# Patient Record
Sex: Male | Born: 1960 | Race: White | Hispanic: No | Smoking: Never smoker
Health system: Southern US, Community
[De-identification: ages and names within clinical notes are randomized; demographics above are authoritative.]

## PROBLEM LIST (undated history)

## (undated) DIAGNOSIS — E039 Hypothyroidism, unspecified: Secondary | ICD-10-CM

## (undated) DIAGNOSIS — M1990 Unspecified osteoarthritis, unspecified site: Secondary | ICD-10-CM

## (undated) DIAGNOSIS — B182 Chronic viral hepatitis C: Secondary | ICD-10-CM

## (undated) DIAGNOSIS — N138 Other obstructive and reflux uropathy: Secondary | ICD-10-CM

## (undated) DIAGNOSIS — K759 Inflammatory liver disease, unspecified: Secondary | ICD-10-CM

## (undated) DIAGNOSIS — F602 Antisocial personality disorder: Secondary | ICD-10-CM

## (undated) DIAGNOSIS — I10 Essential (primary) hypertension: Secondary | ICD-10-CM

## (undated) DIAGNOSIS — F319 Bipolar disorder, unspecified: Secondary | ICD-10-CM

## (undated) DIAGNOSIS — M5020 Other cervical disc displacement, unspecified cervical region: Secondary | ICD-10-CM

## (undated) DIAGNOSIS — B1910 Unspecified viral hepatitis B without hepatic coma: Secondary | ICD-10-CM

## (undated) DIAGNOSIS — E119 Type 2 diabetes mellitus without complications: Secondary | ICD-10-CM

## (undated) DIAGNOSIS — F99 Mental disorder, not otherwise specified: Secondary | ICD-10-CM

## (undated) DIAGNOSIS — K7460 Unspecified cirrhosis of liver: Secondary | ICD-10-CM

## (undated) DIAGNOSIS — N401 Enlarged prostate with lower urinary tract symptoms: Secondary | ICD-10-CM

## (undated) DIAGNOSIS — G8929 Other chronic pain: Secondary | ICD-10-CM

## (undated) HISTORY — DX: Hypothyroidism, unspecified: E03.9

## (undated) HISTORY — DX: Inflammatory liver disease, unspecified: K75.9

## (undated) HISTORY — DX: Mental disorder, not otherwise specified: F99

## (undated) HISTORY — PX: EXPLORATORY LAPAROTOMY: SUR591

## (undated) HISTORY — DX: Essential (primary) hypertension: I10

## (undated) HISTORY — DX: Unspecified cirrhosis of liver: K74.60

## (undated) HISTORY — PX: LAPAROSCOPIC CHOLECYSTECTOMY: SUR755

## (undated) HISTORY — PX: OTHER SURGICAL HISTORY: SHX169

## (undated) HISTORY — DX: Type 2 diabetes mellitus without complications: E11.9

---

## 1998-02-15 ENCOUNTER — Encounter: Admission: RE | Admit: 1998-02-15 | Discharge: 1998-02-15 | Payer: Self-pay | Admitting: Internal Medicine

## 1998-04-14 ENCOUNTER — Encounter: Admission: RE | Admit: 1998-04-14 | Discharge: 1998-04-14 | Payer: Self-pay | Admitting: *Deleted

## 1998-11-24 ENCOUNTER — Emergency Department (HOSPITAL_COMMUNITY): Admission: EM | Admit: 1998-11-24 | Discharge: 1998-11-24 | Payer: Self-pay | Admitting: Emergency Medicine

## 1998-12-10 ENCOUNTER — Emergency Department (HOSPITAL_COMMUNITY): Admission: EM | Admit: 1998-12-10 | Discharge: 1998-12-10 | Payer: Self-pay | Admitting: Emergency Medicine

## 1999-02-05 ENCOUNTER — Emergency Department (HOSPITAL_COMMUNITY): Admission: EM | Admit: 1999-02-05 | Discharge: 1999-02-05 | Payer: Self-pay | Admitting: Emergency Medicine

## 1999-02-05 ENCOUNTER — Encounter: Payer: Self-pay | Admitting: Emergency Medicine

## 1999-06-27 ENCOUNTER — Encounter: Admission: RE | Admit: 1999-06-27 | Discharge: 1999-06-27 | Payer: Self-pay | Admitting: Hematology and Oncology

## 1999-07-16 ENCOUNTER — Encounter: Admission: RE | Admit: 1999-07-16 | Discharge: 1999-07-16 | Payer: Self-pay | Admitting: Internal Medicine

## 1999-08-14 ENCOUNTER — Emergency Department (HOSPITAL_COMMUNITY): Admission: EM | Admit: 1999-08-14 | Discharge: 1999-08-14 | Payer: Self-pay | Admitting: Emergency Medicine

## 1999-08-14 ENCOUNTER — Encounter: Payer: Self-pay | Admitting: Emergency Medicine

## 1999-10-04 ENCOUNTER — Emergency Department (HOSPITAL_COMMUNITY): Admission: EM | Admit: 1999-10-04 | Discharge: 1999-10-04 | Payer: Self-pay | Admitting: Emergency Medicine

## 1999-10-04 ENCOUNTER — Encounter: Payer: Self-pay | Admitting: Emergency Medicine

## 1999-12-23 ENCOUNTER — Emergency Department (HOSPITAL_COMMUNITY): Admission: EM | Admit: 1999-12-23 | Discharge: 1999-12-23 | Payer: Self-pay | Admitting: Emergency Medicine

## 1999-12-23 ENCOUNTER — Encounter: Payer: Self-pay | Admitting: Emergency Medicine

## 2000-04-23 ENCOUNTER — Encounter: Payer: Self-pay | Admitting: Emergency Medicine

## 2000-04-23 ENCOUNTER — Emergency Department (HOSPITAL_COMMUNITY): Admission: AD | Admit: 2000-04-23 | Discharge: 2000-04-23 | Payer: Self-pay

## 2000-04-24 ENCOUNTER — Encounter: Payer: Self-pay | Admitting: Emergency Medicine

## 2001-11-22 ENCOUNTER — Encounter: Payer: Self-pay | Admitting: *Deleted

## 2001-11-22 ENCOUNTER — Emergency Department (HOSPITAL_COMMUNITY): Admission: EM | Admit: 2001-11-22 | Discharge: 2001-11-22 | Payer: Self-pay | Admitting: *Deleted

## 2002-06-06 ENCOUNTER — Emergency Department (HOSPITAL_COMMUNITY): Admission: EM | Admit: 2002-06-06 | Discharge: 2002-06-06 | Payer: Self-pay | Admitting: Emergency Medicine

## 2002-11-23 ENCOUNTER — Inpatient Hospital Stay (HOSPITAL_COMMUNITY): Admission: EM | Admit: 2002-11-23 | Discharge: 2002-12-06 | Payer: Self-pay | Admitting: Psychiatry

## 2003-01-12 ENCOUNTER — Encounter: Payer: Self-pay | Admitting: Internal Medicine

## 2003-01-12 ENCOUNTER — Ambulatory Visit (HOSPITAL_COMMUNITY): Admission: RE | Admit: 2003-01-12 | Discharge: 2003-01-12 | Payer: Self-pay | Admitting: Internal Medicine

## 2003-01-18 ENCOUNTER — Observation Stay (HOSPITAL_COMMUNITY): Admission: RE | Admit: 2003-01-18 | Discharge: 2003-01-19 | Payer: Self-pay | Admitting: Internal Medicine

## 2003-04-01 ENCOUNTER — Inpatient Hospital Stay (HOSPITAL_COMMUNITY): Admission: EM | Admit: 2003-04-01 | Discharge: 2003-04-08 | Payer: Self-pay | Admitting: Psychiatry

## 2003-05-30 ENCOUNTER — Emergency Department (HOSPITAL_COMMUNITY): Admission: EM | Admit: 2003-05-30 | Discharge: 2003-05-30 | Payer: Self-pay

## 2003-08-30 ENCOUNTER — Emergency Department (HOSPITAL_COMMUNITY): Admission: EM | Admit: 2003-08-30 | Discharge: 2003-08-30 | Payer: Self-pay | Admitting: Emergency Medicine

## 2003-11-25 ENCOUNTER — Emergency Department (HOSPITAL_COMMUNITY): Admission: EM | Admit: 2003-11-25 | Discharge: 2003-11-25 | Payer: Self-pay | Admitting: Emergency Medicine

## 2004-01-12 ENCOUNTER — Inpatient Hospital Stay (HOSPITAL_COMMUNITY): Admission: RE | Admit: 2004-01-12 | Discharge: 2004-01-31 | Payer: Self-pay | Admitting: Psychiatry

## 2004-01-27 ENCOUNTER — Encounter: Payer: Self-pay | Admitting: Emergency Medicine

## 2004-05-24 ENCOUNTER — Emergency Department (HOSPITAL_COMMUNITY): Admission: EM | Admit: 2004-05-24 | Discharge: 2004-05-25 | Payer: Self-pay | Admitting: Emergency Medicine

## 2007-04-07 ENCOUNTER — Emergency Department (HOSPITAL_COMMUNITY): Admission: EM | Admit: 2007-04-07 | Discharge: 2007-04-07 | Payer: Self-pay | Admitting: Emergency Medicine

## 2007-05-13 ENCOUNTER — Emergency Department (HOSPITAL_COMMUNITY): Admission: EM | Admit: 2007-05-13 | Discharge: 2007-05-13 | Payer: Self-pay | Admitting: Emergency Medicine

## 2007-06-30 ENCOUNTER — Ambulatory Visit: Payer: Self-pay | Admitting: Psychiatry

## 2007-06-30 ENCOUNTER — Inpatient Hospital Stay (HOSPITAL_COMMUNITY): Admission: AD | Admit: 2007-06-30 | Discharge: 2007-07-06 | Payer: Self-pay | Admitting: Psychiatry

## 2007-10-01 ENCOUNTER — Emergency Department (HOSPITAL_COMMUNITY): Admission: EM | Admit: 2007-10-01 | Discharge: 2007-10-01 | Payer: Self-pay | Admitting: Emergency Medicine

## 2007-12-03 ENCOUNTER — Emergency Department (HOSPITAL_COMMUNITY): Admission: EM | Admit: 2007-12-03 | Discharge: 2007-12-03 | Payer: Self-pay | Admitting: Emergency Medicine

## 2008-03-18 ENCOUNTER — Emergency Department (HOSPITAL_COMMUNITY): Admission: EM | Admit: 2008-03-18 | Discharge: 2008-03-18 | Payer: Self-pay | Admitting: Emergency Medicine

## 2010-03-05 ENCOUNTER — Ambulatory Visit (HOSPITAL_COMMUNITY): Admission: RE | Admit: 2010-03-05 | Discharge: 2010-03-05 | Payer: Self-pay | Admitting: Family Medicine

## 2010-04-21 ENCOUNTER — Emergency Department (HOSPITAL_COMMUNITY): Admission: EM | Admit: 2010-04-21 | Discharge: 2010-04-21 | Payer: Self-pay | Admitting: Emergency Medicine

## 2010-08-23 ENCOUNTER — Encounter: Payer: Self-pay | Admitting: Emergency Medicine

## 2010-08-23 ENCOUNTER — Inpatient Hospital Stay (HOSPITAL_COMMUNITY): Admission: EM | Admit: 2010-08-23 | Discharge: 2010-08-24 | Payer: Self-pay | Admitting: General Surgery

## 2010-09-03 ENCOUNTER — Other Ambulatory Visit: Payer: Self-pay | Admitting: Emergency Medicine

## 2010-09-04 ENCOUNTER — Ambulatory Visit: Payer: Self-pay | Admitting: Psychiatry

## 2010-09-04 ENCOUNTER — Inpatient Hospital Stay (HOSPITAL_COMMUNITY)
Admission: EM | Admit: 2010-09-04 | Discharge: 2010-09-17 | Payer: Self-pay | Source: Home / Self Care | Admitting: Psychiatry

## 2010-09-09 ENCOUNTER — Emergency Department (HOSPITAL_COMMUNITY)
Admission: EM | Admit: 2010-09-09 | Discharge: 2010-09-09 | Disposition: A | Discharge status: Other Acute Inpatient Hospital | Payer: Self-pay | Source: Home / Self Care | Admitting: Emergency Medicine

## 2010-11-18 ENCOUNTER — Encounter: Payer: Self-pay | Admitting: Family Medicine

## 2011-01-08 LAB — CARDIAC PANEL(CRET KIN+CKTOT+MB+TROPI)
Total CK: 120 U/L (ref 7–232)
Troponin I: 0.01 ng/mL (ref 0.00–0.06)

## 2011-01-08 LAB — URINE MICROSCOPIC-ADD ON

## 2011-01-08 LAB — CBC
HCT: 39.3 % (ref 39.0–52.0)
MCH: 31 pg (ref 26.0–34.0)
MCHC: 33.5 g/dL (ref 30.0–36.0)
RDW: 14.2 % (ref 11.5–15.5)

## 2011-01-08 LAB — URINALYSIS, ROUTINE W REFLEX MICROSCOPIC
Bilirubin Urine: NEGATIVE
Glucose, UA: NEGATIVE mg/dL
Ketones, ur: NEGATIVE mg/dL
Leukocytes, UA: NEGATIVE

## 2011-01-08 LAB — COMPREHENSIVE METABOLIC PANEL
ALT: 34 U/L (ref 0–53)
AST: 36 U/L (ref 0–37)
Albumin: 3.8 g/dL (ref 3.5–5.2)
Alkaline Phosphatase: 89 U/L (ref 39–117)
CO2: 27 mEq/L (ref 19–32)
Calcium: 8.8 mg/dL (ref 8.4–10.5)
GFR calc non Af Amer: >60 mL/min (ref >60)
Total Bilirubin: 0.7 mg/dL (ref 0.3–1.2)
Total Protein: 7.3 g/dL (ref 6.0–8.3)

## 2011-01-08 LAB — POCT I-STAT, CHEM 8
BUN: 13 mg/dL (ref 6–23)
Creatinine, Ser: 0.9 mg/dL (ref 0.4–1.5)
Hemoglobin: 15.3 g/dL (ref 13.0–17.0)
TCO2: 32 mmol/L (ref 0–100)

## 2011-01-08 LAB — DIFFERENTIAL
Lymphocytes Relative: 21 % (ref 12–46)
Monocytes Absolute: 0.8 10*3/uL (ref 0.1–1.0)
Neutro Abs: 7.6 10*3/uL (ref 1.7–7.7)

## 2011-01-08 LAB — LACTATE DEHYDROGENASE: LDH: 165 U/L (ref 94–250)

## 2011-01-08 LAB — RAPID URINE DRUG SCREEN, HOSP PERFORMED
Benzodiazepines: POSITIVE — AB
Opiates: NOT DETECTED
Tetrahydrocannabinol: POSITIVE — AB

## 2011-01-09 LAB — CBC
Hemoglobin: 14 g/dL (ref 13.0–17.0)
MCH: 30.2 pg (ref 26.0–34.0)
MCH: 30.8 pg (ref 26.0–34.0)
MCHC: 32.3 g/dL (ref 30.0–36.0)
MCV: 93.2 fL (ref 78.0–100.0)
Platelets: 247 10*3/uL (ref 150–400)
RDW: 14 % (ref 11.5–15.5)
WBC: 12.4 10*3/uL — ABNORMAL HIGH (ref 4.0–10.5)
WBC: 15.7 10*3/uL — ABNORMAL HIGH (ref 4.0–10.5)

## 2011-01-09 LAB — BASIC METABOLIC PANEL
Chloride: 104 mEq/L (ref 96–112)
Glucose, Bld: 153 mg/dL — ABNORMAL HIGH (ref 70–99)
Potassium: 4.5 mEq/L (ref 3.5–5.1)
Sodium: 137 mEq/L (ref 135–145)

## 2011-01-09 LAB — DIFFERENTIAL
Basophils Absolute: 0 10*3/uL (ref 0.0–0.1)
Eosinophils Relative: 1 % (ref 0–5)
Lymphocytes Relative: 17 % (ref 12–46)
Monocytes Absolute: 0.9 10*3/uL (ref 0.1–1.0)
Monocytes Relative: 6 % (ref 3–12)

## 2011-01-09 LAB — POCT I-STAT, CHEM 8
BUN: 10 mg/dL (ref 6–23)
Calcium, Ion: 1.1 mmol/L — ABNORMAL LOW (ref 1.12–1.32)
Chloride: 103 meq/L (ref 96–112)
Creatinine, Ser: 0.8 mg/dL (ref 0.4–1.5)
Glucose, Bld: 116 mg/dL — ABNORMAL HIGH (ref 70–99)
HCT: 46 % (ref 39.0–52.0)
Hemoglobin: 15.6 g/dL (ref 13.0–17.0)
Potassium: 3.7 meq/L (ref 3.5–5.1)
Sodium: 139 meq/L (ref 135–145)
TCO2: 27 mmol/L (ref 0–100)

## 2011-01-09 LAB — CROSSMATCH
ABO/RH(D): O POS
Antibody Screen: NEGATIVE

## 2011-01-09 LAB — ETHANOL: Alcohol, Ethyl (B): <5 mg/dL (ref 0–10)

## 2011-01-09 LAB — GLUCOSE, CAPILLARY: Glucose-Capillary: 146 mg/dL — ABNORMAL HIGH (ref 70–99)

## 2011-01-09 LAB — PROTIME-INR
INR: 0.97 (ref 0.00–1.49)
Prothrombin Time: 13.1 seconds (ref 11.6–15.2)

## 2011-01-09 LAB — ABO/RH: ABO/RH(D): O POS

## 2011-01-09 LAB — APTT: aPTT: 25 seconds (ref 24–37)

## 2011-01-13 LAB — CULTURE, ROUTINE-ABSCESS
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
## 2011-03-15 NOTE — Op Note (Signed)
NAME:  Julian Evans, Julian Evans                          ACCOUNT NO.:  [id number]   MEDICAL RECORD NO.:  [id number]                   PATIENT TYPE:  OBV   LOCATION:  A302                                 FACILITY:  [facility]   PHYSICIAN:  Bernerd Limbo. Leona Carry, M.D.             DATE OF BIRTH:  [birth year]   DATE OF PROCEDURE:  01/18/2003  DATE OF DISCHARGE:                                 OPERATIVE REPORT   PREOPERATIVE DIAGNOSIS:  Chronic cholecystitis and cholelithiasis.   POSTOPERATIVE DIAGNOSIS:  Chronic cholecystitis and cholelithiasis.   OPERATION PERFORMED:  Laparoscopic cholecystectomy.   COMPLICATIONS:  None.   SURGEON:  Bernerd Limbo. Destefano, M.D.   DESCRIPTION OF PROCEDURE:  Under adequate general anesthesia, the patient  was prepped and draped in the usual manner.   A small incision was made above the umbilicus.  Through this opening, a  Veress needle was inserted into the peritoneal cavity, and the abdomen was  insufflated up to 4 to 5 mm of carbon dioxide, 10 to 15 cc of pressure.  The  needle was removed, and the trocar with the Visiport attached was inserted  into the peritoneal cavity.  There was noted to be a lot of adhesions and  omentum adherent to the gallbladder.  A small incision was then made about 2  cm below and to the right of the xiphoid, and through this opening, a 10-mm  trocar was inserted through the 10-mm port.  The [place] dissecting  instrument was inserted.  Two small incisions were made in the right, the  first about 2 cm below the lowest rib, the second 2 cm below and lateral to  the first opening.  Through these openings, 5 mm trocars were inserted.  Through the 5-mm ports, the retracting forceps were inserted.  The  gallbladder was grasped at the tip of the infundibulum and upward and  lateral traction placed on this structure.  The gallbladder was markedly  distended.  Utilizing blunt dissection, the adhesions were removed.  The  cystic duct was identified,  triply clipped distally, singly clipped  proximally, and then transected.  The cystic artery was identified, doubly  clipped distally, singly clipped proximally, and then transected.  There was  a small amount of immediate bleeding after this; however, using two more  clips, the bleeding came under control.  Then utilizing the electrocautery,  the gallbladder was removed with minimal difficulty.  The gallbladder bed  was then thoroughly irrigated, and a few small bleeding points were  electrofulgurated.  At this point in the procedure, all bleeding appeared to  be under control.  Two pieces of Surgicel were placed in the gallbladder  bed.  The gallbladder was then removed from through the upper 10-mm port.  After determining that all bleeding was under control, all of the trocars  were removed and the abdomen  decompressed.  The fascial layers were closed with interrupted #1  Vicryl.  The skin was closed with skin clips.  OpSite dressing applied.   The patient tolerated the procedure nicely and left the operating room in  good condition.                                               Bernerd Limbo. Leona Carry, M.D.    NMD/MEDQ  D:  01/18/2003  T:  01/18/2003  Job:  [id number]
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
## 2011-08-09 LAB — COMPREHENSIVE METABOLIC PANEL
ALT: 149 — ABNORMAL HIGH
Albumin: 3.8
Alkaline Phosphatase: 110
BUN: 17
Calcium: 9.2
Potassium: 4
Sodium: 134 — ABNORMAL LOW
Total Protein: 7.4

## 2011-08-09 LAB — URINALYSIS, ROUTINE W REFLEX MICROSCOPIC
Bilirubin Urine: NEGATIVE
Glucose, UA: NEGATIVE
Specific Gravity, Urine: 1.028
pH: 5.5

## 2011-08-09 LAB — URINE DRUGS OF ABUSE SCREEN W ALC, ROUTINE (REF LAB)
Cocaine Metabolites: NEGATIVE
Ethyl Alcohol: <5
Methadone: NEGATIVE
Opiate Screen, Urine: NEGATIVE
Phencyclidine (PCP): NEGATIVE
Propoxyphene: NEGATIVE

## 2011-08-09 LAB — CBC
MCV: 92.8
RDW: 13.6
WBC: 12 — ABNORMAL HIGH

## 2011-08-09 LAB — HEPATITIS PANEL, ACUTE
HCV Ab: POSITIVE — AB
Hep A IgM: NEGATIVE
Hep B C IgM: NEGATIVE
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
## 2013-01-06 ENCOUNTER — Emergency Department (HOSPITAL_COMMUNITY)
Admission: EM | Admit: 2013-01-06 | Discharge: 2013-01-07 | Disposition: A | Discharge status: Other Acute Inpatient Hospital | Payer: Medicaid Other | Attending: Emergency Medicine | Admitting: Emergency Medicine

## 2013-01-06 ENCOUNTER — Telehealth (HOSPITAL_COMMUNITY): Payer: Self-pay

## 2013-01-06 ENCOUNTER — Ambulatory Visit (HOSPITAL_COMMUNITY): Admission: RE | Admit: 2013-01-06 | Payer: Self-pay | Source: Home / Self Care | Admitting: Psychiatry

## 2013-01-06 ENCOUNTER — Encounter (HOSPITAL_COMMUNITY): Payer: Self-pay

## 2013-01-06 DIAGNOSIS — Z862 Personal history of diseases of the blood and blood-forming organs and certain disorders involving the immune mechanism: Secondary | ICD-10-CM | POA: Insufficient documentation

## 2013-01-06 DIAGNOSIS — F1910 Other psychoactive substance abuse, uncomplicated: Secondary | ICD-10-CM

## 2013-01-06 DIAGNOSIS — R443 Hallucinations, unspecified: Secondary | ICD-10-CM

## 2013-01-06 DIAGNOSIS — E119 Type 2 diabetes mellitus without complications: Secondary | ICD-10-CM | POA: Insufficient documentation

## 2013-01-06 DIAGNOSIS — E876 Hypokalemia: Secondary | ICD-10-CM | POA: Insufficient documentation

## 2013-01-06 DIAGNOSIS — R454 Irritability and anger: Secondary | ICD-10-CM

## 2013-01-06 DIAGNOSIS — IMO0002 Reserved for concepts with insufficient information to code with codable children: Secondary | ICD-10-CM | POA: Insufficient documentation

## 2013-01-06 DIAGNOSIS — Z8639 Personal history of other endocrine, nutritional and metabolic disease: Secondary | ICD-10-CM | POA: Insufficient documentation

## 2013-01-06 DIAGNOSIS — Z8719 Personal history of other diseases of the digestive system: Secondary | ICD-10-CM | POA: Insufficient documentation

## 2013-01-06 DIAGNOSIS — I10 Essential (primary) hypertension: Secondary | ICD-10-CM | POA: Insufficient documentation

## 2013-01-06 DIAGNOSIS — R45850 Homicidal ideations: Secondary | ICD-10-CM

## 2013-01-06 DIAGNOSIS — F489 Nonpsychotic mental disorder, unspecified: Secondary | ICD-10-CM | POA: Insufficient documentation
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
## 2013-01-07 ENCOUNTER — Encounter (HOSPITAL_COMMUNITY): Payer: Self-pay | Admitting: *Deleted

## 2013-01-07 ENCOUNTER — Inpatient Hospital Stay (HOSPITAL_COMMUNITY)
Admission: EM | Admit: 2013-01-07 | Discharge: 2013-01-15 | DRG: 885 | Disposition: A | Discharge status: Home / Self Care | Payer: MEDICAID | Source: Intra-hospital | Attending: Psychiatry | Admitting: Psychiatry

## 2013-01-07 DIAGNOSIS — F3130 Bipolar disorder, current episode depressed, mild or moderate severity, unspecified: Secondary | ICD-10-CM | POA: Diagnosis present

## 2013-01-07 DIAGNOSIS — K759 Inflammatory liver disease, unspecified: Secondary | ICD-10-CM

## 2013-01-07 DIAGNOSIS — F1010 Alcohol abuse, uncomplicated: Secondary | ICD-10-CM

## 2013-01-07 DIAGNOSIS — I10 Essential (primary) hypertension: Secondary | ICD-10-CM

## 2013-01-07 DIAGNOSIS — K7460 Unspecified cirrhosis of liver: Secondary | ICD-10-CM | POA: Diagnosis present

## 2013-01-07 DIAGNOSIS — F411 Generalized anxiety disorder: Secondary | ICD-10-CM | POA: Diagnosis present

## 2013-01-07 DIAGNOSIS — F4310 Post-traumatic stress disorder, unspecified: Secondary | ICD-10-CM | POA: Diagnosis present

## 2013-01-07 DIAGNOSIS — F99 Mental disorder, not otherwise specified: Secondary | ICD-10-CM

## 2013-01-07 DIAGNOSIS — R45850 Homicidal ideations: Secondary | ICD-10-CM

## 2013-01-07 DIAGNOSIS — E039 Hypothyroidism, unspecified: Secondary | ICD-10-CM | POA: Diagnosis present

## 2013-01-07 DIAGNOSIS — E119 Type 2 diabetes mellitus without complications: Secondary | ICD-10-CM | POA: Insufficient documentation

## 2013-01-07 DIAGNOSIS — Z79899 Other long term (current) drug therapy: Secondary | ICD-10-CM

## 2013-01-07 HISTORY — DX: Other cervical disc displacement, unspecified cervical region: M50.20

## 2013-01-07 LAB — COMPREHENSIVE METABOLIC PANEL
ALT: 61 U/L — ABNORMAL HIGH (ref 0–53)
AST: 63 U/L — ABNORMAL HIGH (ref 0–37)
Albumin: 3.8 g/dL (ref 3.5–5.2)
Alkaline Phosphatase: 103 U/L (ref 39–117)
CO2: 27 mEq/L (ref 19–32)
Chloride: 94 mEq/L — ABNORMAL LOW (ref 96–112)
Creatinine, Ser: 0.82 mg/dL (ref 0.50–1.35)
GFR calc non Af Amer: >90 mL/min (ref >90)
Potassium: 3.2 mEq/L — ABNORMAL LOW (ref 3.5–5.1)
Sodium: 134 mEq/L — ABNORMAL LOW (ref 135–145)
Total Bilirubin: 0.3 mg/dL (ref 0.3–1.2)

## 2013-01-07 LAB — CBC WITH DIFFERENTIAL/PLATELET
Basophils Absolute: 0 10*3/uL (ref 0.0–0.1)
HCT: 42.8 % (ref 39.0–52.0)
Lymphocytes Relative: 35 % (ref 12–46)
Monocytes Absolute: 0.8 10*3/uL (ref 0.1–1.0)
Neutro Abs: 4.8 10*3/uL (ref 1.7–7.7)
Neutrophils Relative %: 54 % (ref 43–77)
RDW: 14.3 % (ref 11.5–15.5)
WBC: 8.9 10*3/uL (ref 4.0–10.5)

## 2013-01-07 LAB — ETHANOL: Alcohol, Ethyl (B): 152 mg/dL — ABNORMAL HIGH (ref 0–11)

## 2013-01-07 LAB — RAPID URINE DRUG SCREEN, HOSP PERFORMED
Barbiturates: NOT DETECTED
Tetrahydrocannabinol: POSITIVE — AB

## 2013-01-07 MED ORDER — ZOLPIDEM TARTRATE 5 MG PO TABS: 5.0000 mg | ORAL_TABLET | Freq: Every evening | ORAL | Status: DC | PRN

## 2013-01-07 MED ORDER — ALUM & MAG HYDROXIDE-SIMETH 200-200-20 MG/5ML PO SUSP: 30.0000 mL | ORAL | Status: DC | PRN

## 2013-01-07 MED ORDER — LISINOPRIL 10 MG PO TABS
10.0000 mg | ORAL_TABLET | Freq: Every day | ORAL | Status: DC
  Administered 2013-01-07 – 2013-01-15 (×6): 10 mg via ORAL
  Filled 2013-01-07 (×10): qty 1

## 2013-01-07 MED ORDER — ACETAMINOPHEN 325 MG PO TABS: 650.0000 mg | ORAL_TABLET | ORAL | Status: DC | PRN

## 2013-01-07 MED ORDER — IBUPROFEN 600 MG PO TABS: 600.0000 mg | ORAL_TABLET | Freq: Three times a day (TID) | ORAL | Status: DC | PRN

## 2013-01-07 MED ORDER — HYDROXYZINE HCL 25 MG PO TABS
25.0000 mg | ORAL_TABLET | Freq: Four times a day (QID) | ORAL | Status: AC | PRN
  Administered 2013-01-10: 25 mg via ORAL

## 2013-01-07 MED ORDER — MAGNESIUM HYDROXIDE 400 MG/5ML PO SUSP: 30.0000 mL | Freq: Every day | ORAL | Status: DC | PRN

## 2013-01-07 MED ORDER — ACETAMINOPHEN 325 MG PO TABS: 650.0000 mg | ORAL_TABLET | Freq: Four times a day (QID) | ORAL | Status: DC | PRN

## 2013-01-07 MED ORDER — LOPERAMIDE HCL 2 MG PO CAPS: 2.0000 mg | ORAL_CAPSULE | ORAL | Status: AC | PRN

## 2013-01-07 MED ORDER — CHLORDIAZEPOXIDE HCL 25 MG PO CAPS
50.0000 mg | ORAL_CAPSULE | Freq: Once | ORAL | Status: AC
  Administered 2013-01-07: 50 mg via ORAL
  Filled 2013-01-07: qty 2

## 2013-01-07 MED ORDER — ONDANSETRON 4 MG PO TBDP: 4.0000 mg | ORAL_TABLET | Freq: Four times a day (QID) | ORAL | Status: AC | PRN

## 2013-01-07 MED ORDER — POTASSIUM CHLORIDE CRYS ER 20 MEQ PO TBCR
40.0000 meq | EXTENDED_RELEASE_TABLET | Freq: Once | ORAL | Status: AC
  Administered 2013-01-07: 40 meq via ORAL
  Filled 2013-01-07: qty 2

## 2013-01-07 MED ORDER — CHLORDIAZEPOXIDE HCL 25 MG PO CAPS
25.0000 mg | ORAL_CAPSULE | Freq: Three times a day (TID) | ORAL | Status: AC
  Administered 2013-01-09 – 2013-01-10 (×3): 25 mg via ORAL
  Filled 2013-01-07 (×3): qty 1

## 2013-01-07 MED ORDER — LORAZEPAM 1 MG PO TABS
1.0000 mg | ORAL_TABLET | Freq: Three times a day (TID) | ORAL | Status: DC | PRN
  Administered 2013-01-07: 1 mg via ORAL
  Filled 2013-01-07: qty 1

## 2013-01-07 MED ORDER — TRAZODONE HCL 50 MG PO TABS
50.0000 mg | ORAL_TABLET | Freq: Every day | ORAL | Status: DC
Start: 2013-01-07 — End: 2013-01-10
  Administered 2013-01-07 – 2013-01-09 (×3): 50 mg via ORAL
  Filled 2013-01-07 (×6): qty 1

## 2013-01-07 MED ORDER — CHLORDIAZEPOXIDE HCL 25 MG PO CAPS
25.0000 mg | ORAL_CAPSULE | Freq: Four times a day (QID) | ORAL | Status: AC
  Administered 2013-01-07 – 2013-01-09 (×5): 25 mg via ORAL
  Filled 2013-01-07 (×5): qty 1

## 2013-01-07 MED ORDER — INFLUENZA VIRUS VACC SPLIT PF IM SUSP: 0.5000 mL | INTRAMUSCULAR | Status: AC

## 2013-01-07 MED ORDER — ADULT MULTIVITAMIN W/MINERALS CH
1.0000 | ORAL_TABLET | Freq: Every day | ORAL | Status: DC
  Administered 2013-01-07 – 2013-01-15 (×6): 1 via ORAL
  Filled 2013-01-07 (×10): qty 1

## 2013-01-07 MED ORDER — CHLORDIAZEPOXIDE HCL 25 MG PO CAPS
25.0000 mg | ORAL_CAPSULE | Freq: Four times a day (QID) | ORAL | Status: AC | PRN
  Filled 2013-01-07: qty 1

## 2013-01-07 MED ORDER — ONDANSETRON HCL 4 MG PO TABS: 4.0000 mg | ORAL_TABLET | Freq: Three times a day (TID) | ORAL | Status: DC | PRN

## 2013-01-07 MED ORDER — PNEUMOCOCCAL VAC POLYVALENT 25 MCG/0.5ML IJ INJ: 0.5000 mL | INJECTION | INTRAMUSCULAR | Status: AC

## 2013-01-07 MED ORDER — CHLORDIAZEPOXIDE HCL 25 MG PO CAPS: 25.0000 mg | ORAL_CAPSULE | Freq: Every day | ORAL | Status: AC

## 2013-01-07 MED ORDER — NICOTINE 21 MG/24HR TD PT24: 21.0000 mg | MEDICATED_PATCH | Freq: Every day | TRANSDERMAL | Status: DC

## 2013-01-07 MED ORDER — THIAMINE HCL 100 MG/ML IJ SOLN: 100.0000 mg | Freq: Once | INTRAMUSCULAR | Status: DC

## 2013-01-07 MED ORDER — INSULIN ASPART 100 UNIT/ML ~~LOC~~ SOLN
0.0000 [IU] | Freq: Three times a day (TID) | SUBCUTANEOUS | Status: DC
  Administered 2013-01-09: 2 [IU] via SUBCUTANEOUS
  Administered 2013-01-10: 3 [IU] via SUBCUTANEOUS
  Administered 2013-01-10 – 2013-01-11 (×2): 5 [IU] via SUBCUTANEOUS
  Administered 2013-01-11: 2 [IU] via SUBCUTANEOUS
  Administered 2013-01-12: 5 [IU] via SUBCUTANEOUS
  Administered 2013-01-12: 3 [IU] via SUBCUTANEOUS
  Administered 2013-01-13 – 2013-01-15 (×4): 2 [IU] via SUBCUTANEOUS

## 2013-01-07 MED ORDER — NICOTINE 21 MG/24HR TD PT24
21.0000 mg | MEDICATED_PATCH | Freq: Every day | TRANSDERMAL | Status: DC
  Filled 2013-01-07 (×3): qty 1

## 2013-01-07 MED ORDER — VITAMIN B-1 100 MG PO TABS
100.0000 mg | ORAL_TABLET | Freq: Every day | ORAL | Status: DC
  Administered 2013-01-09 – 2013-01-15 (×5): 100 mg via ORAL
  Filled 2013-01-07 (×9): qty 1

## 2013-01-07 MED ORDER — CHLORDIAZEPOXIDE HCL 25 MG PO CAPS
25.0000 mg | ORAL_CAPSULE | ORAL | Status: AC
  Filled 2013-01-07: qty 1

## 2013-01-07 MED ORDER — NAPROXEN SODIUM 275 MG PO TABS
275.0000 mg | ORAL_TABLET | Freq: Two times a day (BID) | ORAL | Status: DC
Start: 2013-01-07 — End: 2013-01-08
  Administered 2013-01-07: 275 mg via ORAL
  Filled 2013-01-07 (×5): qty 1

## 2013-01-07 NOTE — ED Provider Notes (Signed)
Medical screening examination/treatment/procedure(s) were performed by non-physician practitioner and as supervising physician I was immediately available for consultation/collaboration.  Olivia Mackie, MD 01/07/13 [phone]

## 2013-01-07 NOTE — ED Provider Notes (Signed)
History     CSN: [id number]  Arrival date & time 01/06/13  2349   First MD Initiated Contact with Patient 01/07/13 [phone]      Chief Complaint  Patient presents with  . Medical Clearance    (Consider location/radiation/quality/duration/timing/severity/associated sxs/prior treatment) HPI Julian Evans is a 52 y.o. male who presents to ED with complaint of hearing voices, anger management, and wanting ho hurt others. States has been off his medications, both psychiatric and medical since December 11th, 2013. States medications were lost while traveling. States has not seen his doctor or refilled his meds since. Pt refusing details when talking to me about who he has thoughts about hurting, or does not volunteer any information about his voices and other questions. Pt keeps stating, "I need help." Pt sent here for medical clearance after being assessed at [facility]. Pt deneis SI.    Past Medical History  Diagnosis Date  . Hypertension   . Hypothyroidism   . Mental disorder   . Diabetes mellitus without complication   . Hepatitis   . Cirrhosis of liver     History reviewed. No pertinent past surgical history.  No family history on file.  History  Substance Use Topics  . Smoking status: Never Smoker   . Smokeless tobacco: Not on file  . Alcohol Use: 2.4 oz/week    4 Cans of beer per week      Review of Systems  Psychiatric/Behavioral: Positive for hallucinations, behavioral problems, sleep disturbance and agitation. Negative for suicidal ideas.  All other systems reviewed and are negative.    Allergies  Mellaril and Thorazine  Home Medications  No current outpatient prescriptions on file.  BP 147/87  Pulse 77  Temp(Src) 97.9 F (36.6 C) (Oral)  Resp 20  SpO2 95%  Physical Exam  Nursing note and vitals reviewed. Constitutional: He is oriented to person, place, and time. He appears well-developed and well-nourished. No distress.  HENT:  Head: Normocephalic.  Eyes:  Conjunctivae are normal. Pupils are equal, round, and reactive to light.  Cardiovascular: Normal rate, regular rhythm and normal heart sounds.   Pulmonary/Chest: Effort normal and breath sounds normal. No respiratory distress. He has no wheezes. He has no rales.  Neurological: He is alert and oriented to person, place, and time.  Skin: Skin is warm and dry.  Psychiatric:  Apt avoiding eye contact. Flat affect. Appears angry. Normal thought content and judgement.     ED Course  Procedures (including critical care time)  Results for orders placed during the hospital encounter of 01/06/13  CBC WITH DIFFERENTIAL      Result Value Range   WBC 8.9  4.0 - 10.5 K/uL   RBC 4.76  4.22 - 5.81 MIL/uL   Hemoglobin 14.2  13.0 - 17.0 g/dL   HCT 11.9  14.7 - 82.9 %   MCV 89.9  78.0 - 100.0 fL   MCH 29.8  26.0 - 34.0 pg   MCHC 33.2  30.0 - 36.0 g/dL   RDW 56.2  13.0 - 86.5 %   Platelets 280  150 - 400 K/uL   Neutrophils Relative 54  43 - 77 %   Neutro Abs 4.8  1.7 - 7.7 K/uL   Lymphocytes Relative 35  12 - 46 %   Lymphs Abs 3.1  0.7 - 4.0 K/uL   Monocytes Relative 9  3 - 12 %   Monocytes Absolute 0.8  0.1 - 1.0 K/uL   Eosinophils Relative 2  0 -  5 %   Eosinophils Absolute 0.2  0.0 - 0.7 K/uL   Basophils Relative 0  0 - 1 %   Basophils Absolute 0.0  0.0 - 0.1 K/uL  COMPREHENSIVE METABOLIC PANEL      Result Value Range   Sodium 134 (*) 135 - 145 mEq/L   Potassium 3.2 (*) 3.5 - 5.1 mEq/L   Chloride 94 (*) 96 - 112 mEq/L   CO2 27  19 - 32 mEq/L   Glucose, Bld 129 (*) 70 - 99 mg/dL   BUN 12  6 - 23 mg/dL   Creatinine, Ser 1.61  0.50 - 1.35 mg/dL   Calcium 9.2  8.4 - 09.6 mg/dL   Total Protein 8.0  6.0 - 8.3 g/dL   Albumin 3.8  3.5 - 5.2 g/dL   AST 63 (*) 0 - 37 U/L   ALT 61 (*) 0 - 53 U/L   Alkaline Phosphatase 103  39 - 117 U/L   Total Bilirubin 0.3  0.3 - 1.2 mg/dL   GFR calc non Af Amer >90  >90 mL/min   GFR calc Af Amer >90  >90 mL/min  URINE RAPID DRUG SCREEN (HOSP PERFORMED)       Result Value Range   Opiates NONE DETECTED  NONE DETECTED   Cocaine NONE DETECTED  NONE DETECTED   Benzodiazepines NONE DETECTED  NONE DETECTED   Amphetamines NONE DETECTED  NONE DETECTED   Tetrahydrocannabinol POSITIVE (*) NONE DETECTED   Barbiturates NONE DETECTED  NONE DETECTED  ETHANOL      Result Value Range   Alcohol, Ethyl (B) 152 (*) 0 - 11 mg/dL   PT with hallucinations, alcohol and drug use, HI. Potassium slightly low, 3.2, will replace here, LFTs slightly bumped, suspect due to alcohol use. otherwise unremarkable labs. Spoke with act, will assess.    1. Hallucination   2. Excessive anger   3. Polysubstance abuse   4. Homicidal ideation       MDM  PT with HI, polysubstance abuse, hallucinations. Off of all meds. Here voluntarily. Spoke with ACT. Will assess for placement. Pending [facility]>   Filed Vitals:   01/06/13 2356  BP: 147/87  Pulse: 77  Temp: 97.9 F (36.6 C)  TempSrc: Oral  Resp: 20  SpO2: 95%           Myriam Jacobson Kirichenko, PA-C 01/07/13 0515

## 2013-01-07 NOTE — Progress Notes (Signed)
Adult Psychoeducational Group Note  Date:  01/07/2013 Time:  2015  Group Topic/Focus:  Wrap-Up Group:   The focus of this group is to help patients review their daily goal of treatment and discuss progress on daily workbooks.  Participation Level:  Active  Participation Quality:  Appropriate and Sharing  Affect:  Appropriate  Cognitive:  Alert and Appropriate  Insight: Good  Engagement in Group:  Engaged  Modes of Intervention:  Exploration and Support  Additional Comments:    Humberto Seals Monique 01/07/2013, 9:40 PM
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
## 2013-01-07 NOTE — ED Notes (Addendum)
Security has pt two knives and scissors locked up.

## 2013-01-07 NOTE — Tx Team (Signed)
Initial Interdisciplinary Treatment Plan  PATIENT STRENGTHS: (choose at least two) Ability for insight Active sense of humor Capable of independent living Motivation for treatment/growth  PATIENT STRESSORS: Health problems Medication change or noncompliance Substance abuse   PROBLEM LIST: Problem List/Patient Goals Date to be addressed Date deferred Reason deferred Estimated date of resolution  Depression 01/07/13     Auditory hallucinations 01/07/13                                                DISCHARGE CRITERIA:  Ability to meet basic life and health needs Improved stabilization in mood, thinking, and/or behavior Safe-care adequate arrangements made Verbal commitment to aftercare and medication compliance  PRELIMINARY DISCHARGE PLAN: Attend aftercare/continuing care group Return to previous living arrangement  PATIENT/FAMIILY INVOLVEMENT: This treatment plan has been presented to and reviewed with the patient, GARDY MONTANARI, and/or family member, .  The patient and family have been given the opportunity to ask questions and make suggestions.  Venson Ferencz, [place] 01/07/2013, 4:38 PM

## 2013-01-07 NOTE — ED Notes (Signed)
Pt requesting medication for anxiety. Medication given.

## 2013-01-07 NOTE — [facility] Counselor (Signed)
Patient accepted to [facility] by Donell Sievert, PA. Patients bed assignment is 401-2. EPD-Dr. Bebe Shaggy was notified and made aware. Patients nurse-Sheila was also notified of patients disposition. Call report # is [phone]. Support paperwork completed and faxed to [facility].

## 2013-01-07 NOTE — ED Notes (Signed)
Pt sent over from [facility]; pt called transportation to take pt to [facility] asking for help; pt states hearing voices telling him to hurt his mother; states ran out of meds in December and that's when voices started; calm and cooperative; denies SI

## 2013-01-07 NOTE — ED Notes (Signed)
Pt paperwork received by main ED. Security key placed in back of chart.

## 2013-01-08 DIAGNOSIS — F333 Major depressive disorder, recurrent, severe with psychotic symptoms: Secondary | ICD-10-CM

## 2013-01-08 DIAGNOSIS — F1010 Alcohol abuse, uncomplicated: Secondary | ICD-10-CM

## 2013-01-08 LAB — GLUCOSE, CAPILLARY
Glucose-Capillary: 141 mg/dL — ABNORMAL HIGH (ref 70–99)
Glucose-Capillary: 135 mg/dL — ABNORMAL HIGH (ref 70–99)

## 2013-01-08 MED ORDER — NAPROXEN 250 MG PO TABS
250.0000 mg | ORAL_TABLET | Freq: Two times a day (BID) | ORAL | Status: DC
  Administered 2013-01-08 – 2013-01-15 (×13): 250 mg via ORAL
  Filled 2013-01-08 (×17): qty 1

## 2013-01-08 MED ORDER — GUAIFENESIN ER 600 MG PO TB12
600.0000 mg | ORAL_TABLET | Freq: Two times a day (BID) | ORAL | Status: DC
  Administered 2013-01-08 – 2013-01-14 (×11): 600 mg via ORAL
  Filled 2013-01-08 (×16): qty 1

## 2013-01-08 MED ORDER — ZIPRASIDONE HCL 20 MG PO CAPS
20.0000 mg | ORAL_CAPSULE | Freq: Two times a day (BID) | ORAL | Status: DC
  Administered 2013-01-08 – 2013-01-09 (×3): 20 mg via ORAL
  Filled 2013-01-08 (×6): qty 1

## 2013-01-08 NOTE — Progress Notes (Signed)
D: Pt was calm and cooperative this evening on the unit. Pt observed interacting appropriately within the milieu. Pt is denying any AVH. Pt requested 400 mg of Seroquel for sleep. However pt's pharmacy closed at 1830, and this writer was unable to verify PTA meds for this pt. Pt continues to have hypertension. NP on-call has prescribed lisinopril 10 mg daily for this pt. Pt has now informed Clinical research associate that he has used Azor 40 mg QHS to control his BP. Again this will need to be verified with [facility] in [place]. At this time pt is denying any SI/HI or AVH. Pt attended and participated in group effectively. R: Pt remains safe at this time with q7min checks. Writer will assess this pt's Bp this morning.

## 2013-01-08 NOTE — Progress Notes (Signed)
Adult Psychoeducational Group Note  Date:  01/08/2013 Time:  11:00AM Group Topic/Focus:  Relapse Prevention Planning:   The focus of this group is to define relapse and discuss the need for planning to combat relapse.  Participation Level:  Active  Participation Quality:  Appropriate, Sharing and Supportive  Affect:  Appropriate  Cognitive:  Alert and Appropriate  Insight: Appropriate  Engagement in Group:  Engaged  Modes of Intervention:  Discussion  Additional Comments:   Pt. Was attentive and appropriate during today's group discussion. Pt was able to open up and share what brought him to [facility]. Pt. Shared what places he would like to travel, his favorite food, favorite smell, and coping skills. Pt. Stated that being here has helped him learn and build coping skills.       Bing Plume D 01/08/2013, 11:59 AM

## 2013-01-08 NOTE — Progress Notes (Signed)
D: Earlier this morning, patient was non-compliant with medication regimen (refused all morning medications). Patient's affect/mood was irritable as well, however as the day progresses pt. is now more pleasant and cooperative. He reported on the self inventory sheet that his sleep/appetite is poor, energy level is low and ability to pay attention is poor. Patient rated depression and feelings of hopelessness "10".   A: Support and encouragement provided to patient. Administered scheduled medications per ordering MD. Monitor Q15 minute checks for safety.  R: Patient receptive. Denies SI/HI/AVH. Patient remains safe.
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
## 2013-01-08 NOTE — Tx Team (Addendum)
Interdisciplinary Treatment Plan Update   Date Reviewed:  01/08/2013  Time Reviewed:  10:45 AM  Progress in Treatment:   Attending groups: Yes Participating in groups: Yes Taking medication as prescribed: Yes  Tolerating medication: Yes Family/Significant other contact made: No.  Patient reports no family involvement.  Patient understands diagnosis: Yes  Discussing patient identified problems/goals with staff: Yes Medical problems stabilized or resolved: Yes Denies suicidal/homicidal ideation: No, patient endorses SI but contracts for safety.  He also endorses HI toward mother but no intent.  Patient has not harmed self or others: Yes  For review of initial/current patient goals, please see plan of care.  Estimated Length of Stay:    Reasons for Continued Hospitalization:  Anxiety Depression Medication stabilization Suicidal ideation   New Problems/Goals identified:    Discharge Plan or Barriers:   Home with outpatient follow up  Additional Comments:  Patient admitted with SI.  Patient to be stabilized on medications.  Attendees:  Patient:  01/08/2013 10:45 AM   Signature: Patrick North, MD 01/08/2013 10:45 AM  Signature:Tina Arlana Pouch, RN 01/08/2013 10:45 AM  Signature: Harold Barban, RN 01/08/2013 10:45 AM  Signature: 01/08/2013 10:45 AM  Signature: 01/08/2013 10:45 AM  Signature:  Juline Patch, LCSW 01/08/2013 10:45 AM  Signature: Silverio Decamp, PMH-NP 01/08/2013 10:45 AM  Signature: 01/08/2013 10:45 AM  Signature: Fransisca Kaufmann, [facility] 01/08/2013 10:45 AM  Signature:    Signature:    Signature:      Scribe for Treatment Team:   Juline Patch,  01/08/2013 10:45 AM
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
## 2013-01-08 NOTE — Progress Notes (Signed)
[facility] INPATIENT:  Family/Significant Other Suicide Prevention Education  Suicide Prevention Education:  Patient Refusal for Family/Significant Other Suicide Prevention Education: The patient Julian Evans has refused to provide written consent for family/significant other to be provided Family/Significant Other Suicide Prevention Education during admission and/or prior to discharge.  Physician notified.  Patient advised of no family involvement.  Wynn Banker 01/08/2013, 3:24 PM

## 2013-01-08 NOTE — Progress Notes (Signed)
[facility] LCSW Group Therapy  Relapse Prevention  01/08/2013 3:20 PM  Type of Therapy:  Group Therapy  Participation Level:  Active  Participation Quality:  Redirectable  Affect:  Depressed  Cognitive:  Appropriate  Insight:  Distracting  Engagement in Therapy:  Distracting  Modes of Intervention:  Discussion, Exploration, Problem-solving, Rapport Building and Support  Summary of Progress/Problems:  Patient shared relapse for him means going back into negative behaviors.  Patient had to be redirect for making comments about thoughts of killing his mother.  Patient shared that groups/treatment has not help him.  Patient was asked if he is serious about change or comfortable with things being the way they have always been for him.  Patient did not respond.  Wynn Banker 01/08/2013, 3:20 PM
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
## 2013-01-08 NOTE — Clinical Social Work Note (Signed)
Writer met with patient following afternoon groups to due to patient making several references in group about wanting to kill his mother.  Patient was advised that we understand his need to talk about problems he and mother are having but comments about killing is mother can be unsettling for other patient.  Patient was asked not to make that type of statement during group.  Patient apologized and stated he would make efforts not to make such comments.

## 2013-01-08 NOTE — Progress Notes (Signed)
Recreation Therapy Notes  Date: 03.14.2014 Time: 3:00pm Location: [facility]      Group Topic/Focus: Communication  Participation Level: Active  Participation Quality: Appropriate  Affect: Euthymic  Cognitive: Appropriate   Additional Comments: Patient played two rounds of the "Telephone" game. Patient with peers were unsuccessful at getting a sentence around the group intact. Patient played "What is Different" a game that requires one member of the group to stand in the center of the group, be observed for 5 seconds and then leave the group and change something about their appearance.  Patient peers then guess what has changed about the patient. Patient participated in guessing what had changed about peers. Patient changed the following things about his appearance for peers to guess: clipped his pen to pocket and turned his M&M's around so the logo could be seen. Patient stated was asked to state what relapse prevention means to him and how good communication can effect relapse prevention and benefit his life. Patient stated there were too many things going through his mind to be able to answer the questions asked of him. Patient complained of back pain and asked for a chair to sit in. LRT complied with request.   Jearl Klinefelter, LRT/CTRS  Jearl Klinefelter 01/08/2013 4:04 PM

## 2013-01-09 ENCOUNTER — Encounter (HOSPITAL_COMMUNITY): Payer: Self-pay | Admitting: Registered Nurse

## 2013-01-09 DIAGNOSIS — F3130 Bipolar disorder, current episode depressed, mild or moderate severity, unspecified: Secondary | ICD-10-CM | POA: Diagnosis present

## 2013-01-09 DIAGNOSIS — F4310 Post-traumatic stress disorder, unspecified: Secondary | ICD-10-CM | POA: Diagnosis present

## 2013-01-09 LAB — GLUCOSE, CAPILLARY: Glucose-Capillary: 182 mg/dL — ABNORMAL HIGH (ref 70–99)

## 2013-01-09 MED ORDER — BENZONATATE 100 MG PO CAPS
100.0000 mg | ORAL_CAPSULE | Freq: Two times a day (BID) | ORAL | Status: DC | PRN
  Administered 2013-01-10 – 2013-01-11 (×2): 100 mg via ORAL
  Filled 2013-01-09 (×2): qty 1

## 2013-01-09 MED ORDER — ZIPRASIDONE HCL 60 MG PO CAPS
60.0000 mg | ORAL_CAPSULE | Freq: Every day | ORAL | Status: DC
  Administered 2013-01-10 – 2013-01-14 (×5): 60 mg via ORAL
  Filled 2013-01-09 (×7): qty 1

## 2013-01-09 MED ORDER — METFORMIN HCL 500 MG PO TABS
500.0000 mg | ORAL_TABLET | Freq: Two times a day (BID) | ORAL | Status: DC
  Administered 2013-01-09 – 2013-01-15 (×10): 500 mg via ORAL
  Filled 2013-01-09 (×14): qty 1

## 2013-01-09 MED ORDER — LEVOTHYROXINE SODIUM 25 MCG PO TABS
25.0000 ug | ORAL_TABLET | Freq: Every day | ORAL | Status: DC
  Administered 2013-01-10 – 2013-01-14 (×4): 25 ug via ORAL
  Filled 2013-01-09 (×8): qty 1

## 2013-01-09 MED ORDER — ZIPRASIDONE HCL 20 MG PO CAPS
20.0000 mg | ORAL_CAPSULE | Freq: Every day | ORAL | Status: DC
  Administered 2013-01-10 – 2013-01-11 (×2): 20 mg via ORAL
  Filled 2013-01-09 (×6): qty 1

## 2013-01-09 MED ORDER — GABAPENTIN 100 MG PO CAPS
100.0000 mg | ORAL_CAPSULE | Freq: Three times a day (TID) | ORAL | Status: DC
  Administered 2013-01-09 – 2013-01-15 (×15): 100 mg via ORAL
  Filled 2013-01-09 (×21): qty 1

## 2013-01-09 MED ORDER — QUETIAPINE FUMARATE 400 MG PO TABS
400.0000 mg | ORAL_TABLET | Freq: Every day | ORAL | Status: DC
  Administered 2013-01-09: 400 mg via ORAL
  Filled 2013-01-09 (×2): qty 1

## 2013-01-09 NOTE — Progress Notes (Signed)
Psychoeducational Group Note  Date: 01/09/2013 Time:  1015  Group Topic/Focus:  Identifying Needs:   The focus of this group is to help patients identify their personal needs that have been historically problematic and identify healthy behaviors to address their needs.  Participation Level:  Did Not Attend  Dione Housekeeper

## 2013-01-09 NOTE — Progress Notes (Signed)
D) Pt states taht he was going to sleep this morning and he was not going o go to the groups. States, "I am tired". Pt is upset over the fact he is not geing his medications the way he is suppose to be taking them. States that he takes pain pills at home and wants them here as well. States that he has disc problems and he needs his pain medications to deal with his pain. Refusing sliding scale insulin A) allowing Pt to vent and verbalize his feelings. Encouraging Pt to be aware of what is healthy for him. Encouraged Pt to attend the groups. Alert NP to the fact Pt was not getting his Metformin. Called the pharmacy pt goes to to obtain information. R) Pt can be hostile if he does not get what he wants. Not attending the program.

## 2013-01-09 NOTE — Progress Notes (Signed)
Psychoeducational Group Note  Date:  01/09/2013 Time:  1015 Group Topic/Focus:  Goals Group:   The focus of this group is to help patients establish daily goals to achieve during treatment and discuss how the patient can incorporate goal setting into their daily lives to aide in recovery.  Participation Level: Did Not Attend  Participation Quality:  Not Applicable  Affect:  Not Applicable  Cognitive:  Not Applicable  Insight:  Not Applicable  Engagement in Group: Not Applicable  Additional Comments:    Dione Housekeeper 01/09/2013, 1030

## 2013-01-09 NOTE — Progress Notes (Signed)
Patient ID: Julian Evans, male   DOB: 1961-08-26, 53 y.o.   MRN: [id number] D)  Was out and about on the hall this evening, attended group, came to med window later, after movie was over and talked for some time.  Was upset over regular meds not being ordered,  Was encouraged to let MD know in the am, was given what he could have .  Also asking to change to Dr. Dub Mikes as he has seen him in the past.  Was given several prns to help his various c/o's. A)  Will continue to monitor for safety. Encouragement and support, continue POC R)  Safety maintained.

## 2013-01-09 NOTE — Progress Notes (Signed)
Pt observed by staff eating ice cream with 4 packet of sugar on it and drinking a regular soda. Did not alert MHT of diabetes when requesting snack. CBG 182 which pt says does not concern him (no hs coverage). Spoke with pt at length who is not interested in the frequent CBGs and admin of insulin nor does he have any intent on altering his died. "My 51 year old father has had diabetes since he was a teenager and his sugars are just fine on metformin. I'm the same way." Pt talked at length about his mulitple medical issues and states, "I don't need to be concerned about my diabetes as well." Pt medicated per orders without difficulty. He reports chronic pain of an 8/10 and says it is down from his usual 10/10. Does not request any pain med at this time. Denies SI/HI/AVH and remains safe. Lawrence Marseilles
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
## 2013-01-09 NOTE — Clinical Social Work Note (Signed)
[facility] Group Notes: (Clinical Social Work)   01/09/2013      Type of Therapy:  Group Therapy   Participation Level:  Did Not Attend - Was present initially, called out by doctor, returned, but left immediately.   Ambrose Mantle, LCSW 01/09/2013, 4:18 PM

## 2013-01-10 DIAGNOSIS — F4310 Post-traumatic stress disorder, unspecified: Secondary | ICD-10-CM

## 2013-01-10 DIAGNOSIS — F3130 Bipolar disorder, current episode depressed, mild or moderate severity, unspecified: Principal | ICD-10-CM

## 2013-01-10 LAB — GLUCOSE, CAPILLARY

## 2013-01-10 MED ORDER — TRAZODONE HCL 100 MG PO TABS
100.0000 mg | ORAL_TABLET | Freq: Every day | ORAL | Status: DC
  Administered 2013-01-11 – 2013-01-12 (×2): 100 mg via ORAL
  Filled 2013-01-10 (×5): qty 1

## 2013-01-10 NOTE — Progress Notes (Addendum)
Pt in bed, refusing to get up for VS and breakfast. CBG requires 3 units which was rescheduled for 0800 along with his synthroid. Will bring tray back to the unit and will notify day RN. Julian Evans

## 2013-01-10 NOTE — Progress Notes (Signed)
[facility] MD Progress Note  01/10/2013 3:47 PM IMIR BRUMBACH  MRN:  [id number] Subjective:   Mr. Julian Evans is a 52 y/o male with a past psychiatric. The patient reports that his mother is his most significant stressor as she has "cheated me out of my home."  He reports he bought properties years ago and left it in his mother's name and now she will not give him ownership of the home he renovated.  He reports he is now homeless as he is a convicted felon and could not live with his father. He was given Seroquel last night but appears excessively sedated today.  He states he feels good today having slept well last night. He denies any other complaints.   Diagnosis:  Axis I: Bipolar, Depressed and Post Traumatic Stress Disorder  ADL's:  Intact  Sleep: Good  Appetite:  Good  Suicidal Ideation:  Plan:  Patient denies. Intent:  Patient denies. Means:  Patient denies. Homicidal Ideation:  Plan:  Patient denies. Intent:  Patient denies. Means:  Patient denies. AEB (as evidenced by):  Psychiatric Specialty Exam: Review of Systems  Constitutional: Negative for fever, chills and weight loss.  Respiratory: Positive for cough. Negative for sputum production.   Cardiovascular: Negative for chest pain, palpitations and leg swelling.  Gastrointestinal: Negative for nausea, vomiting, abdominal pain, diarrhea and constipation.  Neurological: Negative for dizziness.    Blood pressure 157/97, pulse 79, temperature 97.6 F (36.4 C), temperature source Oral, resp. rate 16, height 6' (1.829 m), weight 151.501 kg (334 lb).Body mass index is 45.29 kg/(m^2).  General Appearance: Casual and Disheveled  Eye Contact::  Good  Speech:  Normal Rate  Volume:  Normal  Mood:  "Pretty good."  Affect:  Congruent and Full Range  Thought Process:  Circumstantial  Orientation:  Full (Time, Place, and Person)  Thought Content:  WDL  Suicidal Thoughts:  No  Homicidal Thoughts:  No  Memory:  Immediate;   Good Recent;    Fair Remote;   Fair  Judgement:  Poor  Insight:  Lacking  Psychomotor Activity:  Normal  Concentration:  Good  Recall:  Good  Akathisia:  No  Handed:  Right  AIMS (if indicated):   As noted in chart  Assets:  Communication Skills Resilience  Sleep:  Number of Hours: 4.25   Current Medications: Current Facility-Administered Medications  Medication Dose Route Frequency Provider Last Rate Last Dose  . acetaminophen (TYLENOL) tablet 650 mg  650 mg Oral Q6H PRN Rachael Fee, MD      . alum & mag hydroxide-simeth (MAALOX/MYLANTA) 200-200-20 MG/5ML suspension 30 mL  30 mL Oral Q4H PRN Rachael Fee, MD      . benzonatate (TESSALON) capsule 100 mg  100 mg Oral BID PRN Shuvon Rankin, NP   100 mg at 01/10/13 0110  . chlordiazePOXIDE (LIBRIUM) capsule 25 mg  25 mg Oral Q6H PRN Rachael Fee, MD      . chlordiazePOXIDE (LIBRIUM) capsule 25 mg  25 mg Oral BH-qamhs Rachael Fee, MD       Followed by  . [START ON 01/12/2013] chlordiazePOXIDE (LIBRIUM) capsule 25 mg  25 mg Oral Daily Rachael Fee, MD      . gabapentin (NEURONTIN) capsule 100 mg  100 mg Oral TID Shuvon Rankin, NP   100 mg at 01/10/13 0929  . guaiFENesin (MUCINEX) 12 hr tablet 600 mg  600 mg Oral BID Rachael Fee, MD   600 mg at 01/10/13 0931  .  hydrOXYzine (ATARAX/VISTARIL) tablet 25 mg  25 mg Oral Q6H PRN Rachael Fee, MD   25 mg at 01/10/13 0111  . insulin aspart (novoLOG) injection 0-15 Units  0-15 Units Subcutaneous TID WC Verne Spurr, PA-C   3 Units at 01/10/13 0930  . levothyroxine (SYNTHROID, LEVOTHROID) tablet 25 mcg  25 mcg Oral QAC breakfast Shuvon Rankin, NP   25 mcg at 01/10/13 0931  . lisinopril (PRINIVIL,ZESTRIL) tablet 10 mg  10 mg Oral Daily Verne Spurr, PA-C   10 mg at 01/10/13 0932  . loperamide (IMODIUM) capsule 2-4 mg  2-4 mg Oral PRN Rachael Fee, MD      . magnesium hydroxide (MILK OF MAGNESIA) suspension 30 mL  30 mL Oral Daily PRN Rachael Fee, MD      . metFORMIN (GLUCOPHAGE) tablet 500 mg  500 mg  Oral BID WC Shuvon Rankin, NP   500 mg at 01/10/13 0929  . multivitamin with minerals tablet 1 tablet  1 tablet Oral Daily Rachael Fee, MD   1 tablet at 01/10/13 0933  . naproxen (NAPROSYN) tablet 250 mg  250 mg Oral BID WC Mojeed Akintayo   250 mg at 01/10/13 0929  . ondansetron (ZOFRAN-ODT) disintegrating tablet 4 mg  4 mg Oral Q6H PRN Rachael Fee, MD      . thiamine (B-1) injection 100 mg  100 mg Intramuscular Once Rachael Fee, MD      . thiamine (VITAMIN B-1) tablet 100 mg  100 mg Oral Daily Rachael Fee, MD   100 mg at 01/10/13 0930  . traZODone (DESYREL) tablet 50 mg  50 mg Oral QHS Verne Spurr, PA-C   50 mg at 01/09/13 2215  . ziprasidone (GEODON) capsule 20 mg  20 mg Oral Q breakfast Larena Sox, MD   20 mg at 01/10/13 0932  . ziprasidone (GEODON) capsule 60 mg  60 mg Oral Q supper Larena Sox, MD        Lab Results:  Results for orders placed during the hospital encounter of 01/07/13 (from the past 48 hour(s))  GLUCOSE, CAPILLARY     Status: Abnormal   Collection Time    01/08/13  5:34 PM      Result Value Range   Glucose-Capillary 147 (*) 70 - 99 mg/dL  GLUCOSE, CAPILLARY     Status: Abnormal   Collection Time    01/08/13 10:37 PM      Result Value Range   Glucose-Capillary 166 (*) 70 - 99 mg/dL  GLUCOSE, CAPILLARY     Status: Abnormal   Collection Time    01/09/13 11:59 AM      Result Value Range   Glucose-Capillary 139 (*) 70 - 99 mg/dL  GLUCOSE, CAPILLARY     Status: Abnormal   Collection Time    01/09/13  5:07 PM      Result Value Range   Glucose-Capillary 127 (*) 70 - 99 mg/dL  TSH     Status: None   Collection Time    01/09/13  7:26 PM      Result Value Range   TSH 2.540  0.350 - 4.500 uIU/mL  GLUCOSE, CAPILLARY     Status: Abnormal   Collection Time    01/09/13  9:39 PM      Result Value Range   Glucose-Capillary 182 (*) 70 - 99 mg/dL   Comment 1 Notify RN    GLUCOSE, CAPILLARY     Status: Abnormal   Collection Time  01/10/13  6:01  AM      Result Value Range   Glucose-Capillary 175 (*) 70 - 99 mg/dL   Comment 1 Notify RN     Comment 2 Documented in Chart      Physical Findings: AIMS: Facial and Oral Movements Muscles of Facial Expression: None, normal Lips and Perioral Area: None, normal Jaw: None, normal Tongue: None, normal,Extremity Movements Upper (arms, wrists, hands, fingers): None, normal Lower (legs, knees, ankles, toes): None, normal, Trunk Movements Neck, shoulders, hips: None, normal, Overall Severity Severity of abnormal movements (highest score from questions above): None, normal Incapacitation due to abnormal movements: None, normal Patient's awareness of abnormal movements (rate only patient's report): No Awareness, Dental Status Current problems with teeth and/or dentures?: No Does patient usually wear dentures?: No  CIWA:  CIWA-Ar Total: 0 COWS:     Treatment Plan Summary: Daily contact with patient to assess and evaluate symptoms and progress in treatment Medication management As noted below.   Plan: Discontinue Seroquel-discussed with patient that given his blood sugars, this is not an optimal choice of medications.  Increase Geodon 20 mg QAM and 60 mg with supper. Will increase trazodone 100 mg QHS. Medical Decision Making Problem Points:  Established problem, stable/improving (1) and Review of psycho-social stressors (1) Data Points:  Discuss tests with performing physician (1) Order Aims Assessment (2) Review of medication regiment & side effects (2) Review of new medications or change in dosage (2)  I certify that inpatient services furnished can reasonably be expected to improve the patient's condition.   Tan Clopper 01/10/2013, 3:47 PM

## 2013-01-10 NOTE — Clinical Social Work Note (Signed)
[facility] Group Notes: (Clinical Social Work)   01/10/2013      Type of Therapy:  Group Therapy   Participation Level:  Did Not Attend    Ambrose Mantle, LCSW 01/10/2013, 4:45 PM

## 2013-01-10 NOTE — Progress Notes (Signed)
[facility] Group Notes:  (Nursing/MHT/Case Management/Adjunct) Date:  01/09/2013 Time:  2000  Type of Therapy:  Psychoeducational Skills  Participation Level:  Active  Participation Quality:  Monopolizing  Affect:  Excited  Cognitive:  Appropriate  Insight:  Improving  Engagement in Group:  Monopolizing  Modes of Intervention:  Education  Summary of Progress/Problems: The patient verbalized that he had a rough morning, but his day improved by the afternoon. He credits his positive turn around to having had a long talk with his nurse. In addition, he verbalized that the correct medication was finally ordered for him.  His goal for tomorrow is to see how effective the Geodon has been. In addition, he intends to work on trying to "focus better".   Rilea Arutyunyan S 01/10/2013, 12:26 AM

## 2013-01-11 LAB — GLUCOSE, CAPILLARY
Glucose-Capillary: 145 mg/dL — ABNORMAL HIGH (ref 70–99)
Glucose-Capillary: 164 mg/dL — ABNORMAL HIGH (ref 70–99)

## 2013-01-11 MED ORDER — PSEUDOEPHEDRINE HCL ER 120 MG PO TB12
120.0000 mg | ORAL_TABLET | Freq: Two times a day (BID) | ORAL | Status: DC
  Administered 2013-01-11 – 2013-01-15 (×6): 120 mg via ORAL
  Filled 2013-01-11 (×10): qty 1

## 2013-01-11 MED ORDER — PSEUDOEPHEDRINE HCL ER 120 MG PO TB12
120.0000 mg | ORAL_TABLET | Freq: Two times a day (BID) | ORAL | Status: DC
  Filled 2013-01-11 (×2): qty 1

## 2013-01-11 NOTE — Progress Notes (Signed)
Recreation Therapy Notes  Date: 03.17.2014  Time: 2:30am  Location: [facility] Art Room   Group Topic/Focus: Values Clarification   Participation Level:  Did not attend - patient with PA student during group session.   Marykay Lex Shantese Raven, LRT/CTRS    Jearl Klinefelter 01/11/2013 3:31 PM

## 2013-01-11 NOTE — Progress Notes (Signed)
Adult Psychoeducational Group Note  Date:  01/11/2013 Time:  11:00AM Group Topic/Focus:  Self Care:   The focus of this group is to help patients understand the importance of self-care in order to improve or restore emotional, physical, spiritual, interpersonal, and financial health.  Participation Level:  Active  Participation Quality:  Appropriate, Sharing and Supportive  Affect:  Appropriate  Cognitive:  Appropriate  Insight: Appropriate  Engagement in Group:  Engaged  Modes of Intervention:  Discussion  Additional Comments:  Pt. Was attentive and appropriate during today's group discussion. Pt was able to discuss self-care. Pt stated that her make time for self-reflection,having own personal psychotherapy, and her enjoys taking hot baths as daily relaxation.  Bing Plume D 01/11/2013, 2:03 PM

## 2013-01-11 NOTE — Progress Notes (Signed)
Adult Psychoeducational Group Note  Date:  01/11/2013 Time:  12:54 AM  Group Topic/Focus:  Wrap-Up Group:   The focus of this group is to help patients review their daily goal of treatment and discuss progress on daily workbooks.  Participation Level:  Did Not Attend  Participation Quality:  Did not attend  Affect:  Did not attend  Cognitive:  Did not attend  Insight: None  Engagement in Group:  Did not attend  Modes of Intervention:  Did not attend  Additional Comments:  Phinneas slept through wrap-up group tonight.  Einar Gip 01/11/2013, 12:54 AM

## 2013-01-11 NOTE — Tx Team (Signed)
Interdisciplinary Treatment Plan Update   Date Reviewed:  01/11/2013  Time Reviewed:  9:47 AM  Progress in Treatment:   Attending groups: Yes Participating in groups: Yes Taking medication as prescribed: Yes  Tolerating medication: Yes Family/Significant other contact made: No.  Patient reports no family involvement.  Patient understands diagnosis: Yes  Discussing patient identified problems/goals with staff: Yes Medical problems stabilized or resolved: Yes Denies suicidal/homicidal ideation: No, patient endorses SI but contracts for safety.  He also endorses HI toward mother but no intent.  Patient has not harmed self or others: Yes  For review of initial/current patient goals, please see plan of care.  Estimated Length of Stay:    Reasons for Continued Hospitalization:  Anxiety Depression Medication stabilization Suicidal ideation   New Problems/Goals identified:    Discharge Plan or Barriers:   Home with outpatient follow up  Additional Comments:  Patient admitted with SI.  Patient to be stabilized on medications.  Attendees:  Patient:  01/11/2013 9:47 AM   Signature: Julian North, MD 01/11/2013 9:47 AM  Signature:Carol Earlene Plater, RN 01/11/2013 9:47 AM  Signature: Quintella Reichert, RN 01/11/2013 9:47 AM  Signature: 01/11/2013 9:47 AM  Signature: 01/11/2013 9:47 AM  Signature:  Juline Patch, LCSW 01/11/2013 9:47 AM  Signature: Silverio Decamp, PMH-NP 01/11/2013 9:47 AM  Signature: 01/11/2013 9:47 AM  Signature: 01/11/2013 9:47 AM  Signature:    Signature:    Signature:      Scribe for Treatment Team:   Juline Patch,  01/11/2013 9:47 AM

## 2013-01-11 NOTE — Progress Notes (Addendum)
Patient in bed at beginning of this shift, unable to sleep and sounds very congested. He complaint of not able to sleep due to the congestion. Writer notified the PA concerning this patient. He ordered pseudafedrine. Writer to very the medication with pharmacy and administer it as ordered. Will continue to monitor.   0015: Patient received medication without difficulty. Q 15 minute check continues as ordered to maintain safety.

## 2013-01-11 NOTE — Progress Notes (Addendum)
Pt has been asleep, snoring loudly all evening. Pt awakens easily. He remains quite sleepy, speech is slurred however pt is logical and oriented. He attributes his sedation not to his seroquel Sat night but rather to his lack of sleep prior to admit which he reports has been "for at least 5 days." He denies any physical complaints, no distress noted. Pt supported, fall precautions reviewed and strongly encouraged. Pt denying meds at this time (librium, trazadone) as well as SI/HI/AVH. Will continue to monitor closely. Lawrence Marseilles  Add note: Pt did eat dinner but refused evening snack. Fluids provided (milk and gatorade). CBG at hs was 136. Lawrence Marseilles

## 2013-01-11 NOTE — Progress Notes (Signed)
[facility] LCSW Group Therapy        Overcoming Obstacles 1:15 2:30 PM         01/11/2013 2:57 PM  Type of Therapy:  Group Therapy  Participation Level:  Active  Participation Quality:  Appropriate  Affect:  Depressed  Cognitive:  Appropriate  Insight: Engaged  Engagement in Therapy:  Distracting  Modes of Intervention:  Discussion, Exploration, Problem-solving, Rapport Building and Support  Summary of Progress/Problems:  Patient shared the obstacle he needs to overcome is HI toward his mother. He shared his father is trying to intervene by working with both he and his mother. Wynn Banker 01/11/2013, 2:57 PM

## 2013-01-11 NOTE — Progress Notes (Signed)
[facility] MD Progress Note  01/11/2013 4:12 PM DANEL REQUENA  MRN:  [id number] Subjective:  Brandan reports feeling better today as his cold symptoms have greatly improved. Patient was fixated during conversation with writer about why his opiates could not be given for his back pain. He was able to be redirected from the topic with encouragement. The patient is eating all right but not sleeping well. His depression is self-rated at eight and anxiety at five. He was reluctant to answer questions about suicidal thoughts but states "That is always a problem for me." Patient does report conflict with mother over a financial dispute and is still endorsing HI towards her. Patient reports that they used to "be thick as thieves" and this relationship loss contributes to his depression. When asked about hearing voices patient states "I hear those all the time. It is more spiritual and I believe it to be that of a talking wolf."  Diagnosis:   Axis I: Bipolar, Depressed and Post Traumatic Stress Disorder Axis II: Deferred Axis III:  Past Medical History  Diagnosis Date  . Hypertension   . Hypothyroidism   . Mental disorder   . Diabetes mellitus without complication   . Hepatitis   . Cirrhosis of liver   . Herniated disc, cervical     per pt 6 herniated disk in his back causing moderate to severe pain    Axis IV: housing problems, other psychosocial or environmental problems, problems related to legal system/crime, problems related to social environment and problems with primary support group Axis V: 41-50 serious symptoms  ADL's:  Intact  Sleep: Poor  Appetite:  Good  Suicidal Ideation:  Plan:  Will not disclose Intent:  Moderate Means:  Has weapons Homicidal Ideation:  Plan:  Has threatened mother Intent:  Severe Means:  Has weapons AEB (as evidenced by):  Psychiatric Specialty Exam: Review of Systems  Constitutional: Negative.   HENT: Negative.   Eyes: Negative.   Respiratory: Negative.    Cardiovascular: Negative.   Gastrointestinal: Negative.   Genitourinary: Negative.   Musculoskeletal: Positive for back pain (Hx of chronic back pain).  Skin: Negative.   Neurological: Negative.   Endo/Heme/Allergies: Negative.   Psychiatric/Behavioral: Positive for depression, suicidal ideas and hallucinations. The patient is nervous/anxious.     Blood pressure 133/83, pulse 72, temperature 98.1 F (36.7 C), temperature source Oral, resp. rate 16, height 6' (1.829 m), weight 151.501 kg (334 lb).Body mass index is 45.29 kg/(m^2).  General Appearance: Disheveled  Eye Contact::  Good  Speech:  Pressured  Volume:  Increased  Mood:  Angry and Anxious  Affect:  Labile  Thought Process:  Circumstantial  Orientation:  Full (Time, Place, and Person)  Thought Content:  Obsessions and Rumination  Suicidal Thoughts:  Yes.  with intent/plan  Homicidal Thoughts:  Yes.  with intent/plan  Memory:  Immediate;   Fair Recent;   Fair Remote;   Fair  Judgement:  Impaired  Insight:  Lacking  Psychomotor Activity:  Normal  Concentration:  Fair  Recall:  Fair  Akathisia:  No  Handed:  Right  AIMS (if indicated):     Assets:  Housing Leisure Time Resilience  Sleep:  Number of Hours: 6.75   Current Medications: Current Facility-Administered Medications  Medication Dose Route Frequency Provider Last Rate Last Dose  . acetaminophen (TYLENOL) tablet 650 mg  650 mg Oral Q6H PRN Rachael Fee, MD      . alum & mag hydroxide-simeth (MAALOX/MYLANTA) 200-200-20 MG/5ML suspension 30 mL  30 mL Oral Q4H PRN Rachael Fee, MD      . benzonatate (TESSALON) capsule 100 mg  100 mg Oral BID PRN Shuvon Rankin, NP   100 mg at 01/10/13 0110  . chlordiazePOXIDE (LIBRIUM) capsule 25 mg  25 mg Oral BH-qamhs Rachael Fee, MD       Followed by  . [START ON 01/12/2013] chlordiazePOXIDE (LIBRIUM) capsule 25 mg  25 mg Oral Daily Rachael Fee, MD      . gabapentin (NEURONTIN) capsule 100 mg  100 mg Oral TID Shuvon  Rankin, NP   100 mg at 01/11/13 1203  . guaiFENesin (MUCINEX) 12 hr tablet 600 mg  600 mg Oral BID Rachael Fee, MD   600 mg at 01/11/13 0845  . insulin aspart (novoLOG) injection 0-15 Units  0-15 Units Subcutaneous TID WC Verne Spurr, PA-C   5 Units at 01/11/13 1204  . levothyroxine (SYNTHROID, LEVOTHROID) tablet 25 mcg  25 mcg Oral QAC breakfast Shuvon Rankin, NP   25 mcg at 01/11/13 0845  . lisinopril (PRINIVIL,ZESTRIL) tablet 10 mg  10 mg Oral Daily Verne Spurr, PA-C   10 mg at 01/11/13 0846  . magnesium hydroxide (MILK OF MAGNESIA) suspension 30 mL  30 mL Oral Daily PRN Rachael Fee, MD      . metFORMIN (GLUCOPHAGE) tablet 500 mg  500 mg Oral BID WC Shuvon Rankin, NP   500 mg at 01/11/13 0847  . multivitamin with minerals tablet 1 tablet  1 tablet Oral Daily Rachael Fee, MD   1 tablet at 01/11/13 0847  . naproxen (NAPROSYN) tablet 250 mg  250 mg Oral BID WC Mojeed Akintayo   250 mg at 01/11/13 0848  . thiamine (B-1) injection 100 mg  100 mg Intramuscular Once Rachael Fee, MD      . thiamine (VITAMIN B-1) tablet 100 mg  100 mg Oral Daily Rachael Fee, MD   100 mg at 01/11/13 0847  . traZODone (DESYREL) tablet 100 mg  100 mg Oral QHS Iven Finn Puthuvel, MD      . ziprasidone (GEODON) capsule 20 mg  20 mg Oral Q breakfast Larena Sox, MD   20 mg at 01/11/13 0846  . ziprasidone (GEODON) capsule 60 mg  60 mg Oral Q supper Larena Sox, MD   60 mg at 01/10/13 1719    Lab Results:  Results for orders placed during the hospital encounter of 01/07/13 (from the past 48 hour(s))  GLUCOSE, CAPILLARY     Status: Abnormal   Collection Time    01/09/13  5:07 PM      Result Value Range   Glucose-Capillary 127 (*) 70 - 99 mg/dL  TSH     Status: None   Collection Time    01/09/13  7:26 PM      Result Value Range   TSH 2.540  0.350 - 4.500 uIU/mL  GLUCOSE, CAPILLARY     Status: Abnormal   Collection Time    01/09/13  9:39 PM      Result Value Range   Glucose-Capillary 182 (*) 70 -  99 mg/dL   Comment 1 Notify RN    GLUCOSE, CAPILLARY     Status: Abnormal   Collection Time    01/10/13  6:01 AM      Result Value Range   Glucose-Capillary 175 (*) 70 - 99 mg/dL   Comment 1 Notify RN     Comment 2 Documented in Chart  GLUCOSE, CAPILLARY     Status: Abnormal   Collection Time    01/10/13  5:14 PM      Result Value Range   Glucose-Capillary 231 (*) 70 - 99 mg/dL   Comment 1 Documented in Chart     Comment 2 Notify RN    GLUCOSE, CAPILLARY     Status: Abnormal   Collection Time    01/10/13  8:49 PM      Result Value Range   Glucose-Capillary 136 (*) 70 - 99 mg/dL  GLUCOSE, CAPILLARY     Status: Abnormal   Collection Time    01/11/13  7:57 AM      Result Value Range   Glucose-Capillary 152 (*) 70 - 99 mg/dL  GLUCOSE, CAPILLARY     Status: Abnormal   Collection Time    01/11/13 11:56 AM      Result Value Range   Glucose-Capillary 234 (*) 70 - 99 mg/dL    Physical Findings: AIMS: Facial and Oral Movements Muscles of Facial Expression: None, normal Lips and Perioral Area: None, normal Jaw: None, normal Tongue: None, normal,Extremity Movements Upper (arms, wrists, hands, fingers): None, normal Lower (legs, knees, ankles, toes): None, normal, Trunk Movements Neck, shoulders, hips: None, normal, Overall Severity Severity of abnormal movements (highest score from questions above): None, normal Incapacitation due to abnormal movements: None, normal Patient's awareness of abnormal movements (rate only patient's report): No Awareness, Dental Status Current problems with teeth and/or dentures?: Yes (Most teeth have been lost.) Does patient usually wear dentures?: No  CIWA:  CIWA-Ar Total: 4 COWS:     Treatment Plan Summary: Daily contact with patient to assess and evaluate symptoms and progress in treatment Medication management  Plan: 1. Medication management: Medications reviewed and no changes made at present. Patient's Geodon was increased to 80 mg and  trazodone was increased to 100 mg yesterday. 2. Continue crisis stabilization and management.  3. Address health issues-monitoring vital signs, stable at present.  4. Treatment plan in progress to prevent relapse of symptoms.   Medical Decision Making Problem Points:  Established problem, stable/improving (1) and Review of psycho-social stressors (1) Data Points:  Review of medication regiment & side effects (2)  I certify that inpatient services furnished can reasonably be expected to improve the patient's condition.   Fransisca Kaufmann ANN NP-C 01/11/2013, 4:12 PM

## 2013-01-11 NOTE — Progress Notes (Signed)
Patient ID: Julian Evans, male   DOB: 11-Dec-1960, 52 y.o.   MRN: [id number] Patient sept most of am, he did take am meds but declined librium and refused to take sliding scale insulin this am.  Pt agreed to have cbg taken but said he did not want to get involved in taking "needles" and says he has been told different things about what is okay and what is too high for cbg levels.  Also said that after he takes insulin his levels don't get better.  Then at lunchtime he agreed to take the sliding scale.  He is better groomed today and is attending some parts of groups.

## 2013-01-11 NOTE — Progress Notes (Signed)
[facility] LCSW Aftercare Discharge Planning Group Note  01/11/2013 11:31 AM  Participation Quality:  Patient did not attend group.   Wynn Banker 01/11/2013, 11:31 AM

## 2013-01-12 DIAGNOSIS — F29 Unspecified psychosis not due to a substance or known physiological condition: Secondary | ICD-10-CM

## 2013-01-12 LAB — GLUCOSE, CAPILLARY: Glucose-Capillary: 153 mg/dL — ABNORMAL HIGH (ref 70–99)

## 2013-01-12 MED ORDER — HYDROXYZINE HCL 50 MG PO TABS
50.0000 mg | ORAL_TABLET | Freq: Every evening | ORAL | Status: DC | PRN
  Administered 2013-01-12: 50 mg via ORAL
  Filled 2013-01-12: qty 1

## 2013-01-12 MED ORDER — OXYCODONE-ACETAMINOPHEN 5-325 MG PO TABS
1.0000 | ORAL_TABLET | Freq: Every day | ORAL | Status: DC
  Administered 2013-01-12 – 2013-01-14 (×3): 1 via ORAL
  Filled 2013-01-12 (×3): qty 1

## 2013-01-12 MED ORDER — ZIPRASIDONE HCL 40 MG PO CAPS
40.0000 mg | ORAL_CAPSULE | Freq: Every day | ORAL | Status: DC
  Administered 2013-01-14 – 2013-01-15 (×2): 40 mg via ORAL
  Filled 2013-01-12 (×4): qty 1

## 2013-01-12 NOTE — Progress Notes (Signed)
Patient in day room at the beginning of the shift, interacting well with peers. He reported starting the day on a very bad note, but things got better as the day progresses. Mood affect appropriate and he denied SI/HI He endorsed hallucinations. Patient received Trazodone at bed time but returned to the window later to complain of problem falling asleep. He said he has been on Trazodone for 10 years but it did not work, the only thing that worked for him was Seroquel, but the NP he saw during the day shift told him only the MD could prescribe Seroquel; "I have been here for 5 days  And I haven't seen a doctor".  Patient started feeling angry and agitated, and Clinical research associate notified PA , he ordered Vistaril. Q 15 minute check continues as ordered to maintain safety.

## 2013-01-12 NOTE — Progress Notes (Addendum)
D:  Patient has not gotten out of bed yet today.  Has not completed self inventory.  Refused all medications, blood sugar testing, and vital signs.  Has attended no groups.  States he is not taking any more medications until he talks with the doctor.  A:  I approached patient in his room and asked him to come to the medication window.  He declined.  He would not speak to me at that time.  Stated he was waiting to talk with the doctor.  R:  Irritable and unengaged.   Patient awoke and went into the 11:00 group.  When he learned that one of the other patients had upset one of the women in the group he got up and went into the patients room and started yelling and swearing at him.  He was also threatening to break his bones if he did not get up and apologize to the other patient.  Patient was directed on several occasions to leave the other patients room.  Other staff were called down as patient was escalating and threatening.  He did finally leave and go back to his own room.  He agreed to leave the other patient alone and has been pleasant since that time.   Patient has been up and visible in the milieu this afternoon.  Has been polite, pleasant, and cooperative.  Taking all medications as scheduled and has allowed the MHT's to perform all scheduled CBG testing.  Interacting well with staff and peers.  Safety is maintained on the unit.

## 2013-01-12 NOTE — Progress Notes (Signed)
[facility] LCSW Aftercare Discharge Planning Group Note  01/12/2013 1:06 PM  Participation Quality:  Appropriate  Affect:  Appropriate and Depressed  Cognitive:  Appropriate  Insight:  Engaged  Engagement in Group:  Engaged  Modes of Intervention:  Exploration, Problem-solving, Rapport Building and Support  Summary of Progress/Problems:  Patient denies SI but continues to endorse HI toward mother.  He shared the "good Shaune Pollack would have to touch my heart just right not to act on thought."  Patient rates anxiety at ten and all other symptoms at five.  Wynn Banker 01/12/2013, 1:06 PM
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
## 2013-01-12 NOTE — Progress Notes (Signed)
Adult Psychoeducational Group Note  Date:  01/12/2013 Time:  10:11 AM  Group Topic/Focus:  Recovery Goals:   The focus of this group is to identify appropriate goals for recovery and establish a plan to achieve them.  Participation Level:  Did Not Attend   Additional Comments:  Pt remained asleep in bed during this group.   Sharyn Lull 01/12/2013, 10:11 AM

## 2013-01-12 NOTE — Progress Notes (Signed)
Adult Psychoeducational Group Note  Date:  01/12/2013 Time:  10:04 PM  Group Topic/Focus:  Making Healthy Choices:   The focus of this group is to help patients identify negative/unhealthy choices they were using prior to admission and identify positive/healthier coping strategies to replace them upon discharge.  Participation Level:  Active  Participation Quality:  Appropriate  Affect:  Appropriate  Cognitive:  Appropriate  Insight: Appropriate  Engagement in Group:  Supportive  Modes of Intervention:  Problem-solving  Additional Comments:  Mr. Dente was very supportive to everyone during group and provided positive feedback to each of them.    Annell Greening [place] 01/12/2013, 10:04 PM

## 2013-01-12 NOTE — Clinical Social Work Note (Signed)
  [facility] LCSW Group Therapy  Feelings Around Diagnosis 1:15 - 2:30        01/12/2013   3:06 PM     Type of Therapy:  Group Therapy  Participation Level:  Appropriate  Participation Quality:  Appropriate  Affect:  Appropriate  Cognitive:  Attentive Appropriate  Insight:  Engaged  Engagement in Therapy:  Engaged  Modes of Intervention:  Discussion Exploration Problem-Solving Supportive  Summary of Progress/Problems:  Patient shared he is okay with his diagnosis and knows he needs to stay on meds to keep his mind clear.  He asked if a person with a mental health diagnosis is able to go to school to be a Teaching laboratory technician.  Patient advised they are able to do so but need to stay on meds and take care of their mental health needs.  Wynn Banker 01/12/2013  3:06 PM

## 2013-01-12 NOTE — Clinical Social Work Note (Signed)
Writer, ADON and RN met with patient and advised him because of HI toward mother we have a duty to warn.  Patient provided mother's name, Herma Carson and phone # [phone].  Writer attempted to contact mother but no answer or answering device available.

## 2013-01-13 LAB — GLUCOSE, CAPILLARY: Glucose-Capillary: 140 mg/dL — ABNORMAL HIGH (ref 70–99)

## 2013-01-13 MED ORDER — HYDROXYZINE HCL 50 MG PO TABS
50.0000 mg | ORAL_TABLET | Freq: Every evening | ORAL | Status: DC | PRN
  Administered 2013-01-13 – 2013-01-15 (×5): 50 mg via ORAL
  Filled 2013-01-13 (×7): qty 1

## 2013-01-13 NOTE — Progress Notes (Signed)
Chaplain provided brief support with pt at nursing station.   Pt wished to speak with chaplain about ways he could be involved in providing support / being pastoral presence with others after his admission.  Chaplain spoke with pt about life narrative and values.   Will follow pt and assess support needs.

## 2013-01-13 NOTE — Progress Notes (Signed)
Adult Psychoeducational Group Note  Date:  01/13/2013 Time:  9:15 PM  Group Topic/Focus:  Making Healthy Choices:   The focus of this group is to help patients identify negative/unhealthy choices they were using prior to admission and identify positive/healthier coping strategies to replace them upon discharge.  Participation Level:  Did Not Attend  Participation Quality:  did not attend  Affect:  did not attend  Cognitive:  did not attend  Insight: None  Engagement in Group:  did not attend  Modes of Intervention:  did not attend  Additional Comments:  Adelina Mings 01/13/2013, 9:15 PM

## 2013-01-13 NOTE — Progress Notes (Signed)
Recreation Therapy Notes  Date: 03.19.2014 Time: 2:50pm Location: [address] Day Room      Group Topic/Focus: Musician (AAA/T)  Participation Level: Did not attend  Jearl Klinefelter, LRT/CTRS  Jearl Klinefelter 01/13/2013 4:16 PM

## 2013-01-13 NOTE — Progress Notes (Signed)
D: Patient cooperative with staff and peers. Patient's affect/mood is blunted and irritable at times. Attending some groups. Patient non-compliant with morning medication regimen. He reported on the self inventory sheet that his sleep is poor, appetite is good, energy level is low and ability to pay attention is good. Patient rated depression "8" and feelings of hopelessness "6".  A: Support and encouragement provided to patient. Administered scheduled medications per ordering MD. Monitor Q15 minute checks for safety.  R: Patient receptive. Denies SI/HI/AVH. Patient remains safe on the unit.

## 2013-01-13 NOTE — Progress Notes (Signed)
[facility] LCSW Aftercare Discharge Planning Group Note  01/13/2013 10:42 AM  Participation Quality:  Did not attend.  Wynn Banker 01/13/2013, 10:42 AM

## 2013-01-13 NOTE — Progress Notes (Signed)
Recreation Therapy Notes  Date: 03.19.2014 Time: 3:10pm Location: [facility] Art Room      Group Topic/Focus: Goal Setting  Participation Level: Did not attend   Keziah Drotar L Countess Biebel, LRT/CTRS   Djibril Glogowski L 01/13/2013 4:22 PM 

## 2013-01-13 NOTE — Clinical Social Work Note (Signed)
  [facility] LCSW Group Therapy Emotional Regulation  1:15 - 2:30        01/13/2013   3:51 PM     Type of Therapy:  Group Therapy  Participation Level:  Appropriate  Participation Quality:  Appropriate  Affect:  Appropriate  Cognitive:  Attentive Appropriate  Insight:  Engaged  Engagement in Therapy:  Engaged  Modes of Intervention:  Discussion Exploration Problem-Solving Supportive  Summary of Progress/Problems:  Patient shared he has to learn to let go of past hurts.  He shared he tends to hold on and harbor grudges.  Patient shared he wants to work toward change. Wynn Banker 01/13/2013  3:51 PM
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
## 2013-01-13 NOTE — Clinical Social Work Note (Signed)
Writer attempted to contact mother but not answer or answering device.  Writer will check for alter phone number.

## 2013-01-14 DIAGNOSIS — F332 Major depressive disorder, recurrent severe without psychotic features: Secondary | ICD-10-CM

## 2013-01-14 LAB — GLUCOSE, CAPILLARY
Glucose-Capillary: 148 mg/dL — ABNORMAL HIGH (ref 70–99)
Glucose-Capillary: 114 mg/dL — ABNORMAL HIGH (ref 70–99)
Glucose-Capillary: 183 mg/dL — ABNORMAL HIGH (ref 70–99)

## 2013-01-14 MED ORDER — ZOLPIDEM TARTRATE 10 MG PO TABS
10.0000 mg | ORAL_TABLET | Freq: Every evening | ORAL | Status: DC | PRN
  Administered 2013-01-14: 10 mg via ORAL
  Filled 2013-01-14: qty 1

## 2013-01-14 MED ORDER — LORAZEPAM 1 MG PO TABS: 1.0000 mg | ORAL_TABLET | Freq: Once | ORAL | Status: DC

## 2013-01-14 NOTE — Progress Notes (Signed)
[facility] MD Progress Note  01/14/2013 11:17 AM Julian Evans  MRN:  [id number] Subjective:  Patient was very upset this morning, he had threatened to hurt this clinician in group. He had stated " Do not let dr.Hannie Shoe come anywhere near me". He reported being upset due to lack of sleep. Patient had asked to be started on seroquel at 400mg  to help him sleep. It was explained to him yesterday that taking 2 antipsychotic medications is not safe and can cause heart problems, patient accepted this explanation yesterday but became upset this morning.  Continues to have thoughts of hurting his mother. Diagnosis:   Axis I: Major Depression, Recurrent severe Axis II: Deferred Axis III:  Past Medical History  Diagnosis Date  . Hypertension   . Hypothyroidism   . Mental disorder   . Diabetes mellitus without complication   . Hepatitis   . Cirrhosis of liver   . Herniated disc, cervical     per pt 6 herniated disk in his back causing moderate to severe pain    Axis IV: other psychosocial or environmental problems Axis V: 41-50 serious symptoms  ADL's:  Impaired  Sleep: Poor  Appetite:  Fair  Psychiatric Specialty Exam: Review of Systems  Constitutional: Negative.   HENT: Positive for congestion.   Eyes: Negative.   Respiratory: Negative.   Cardiovascular: Negative.   Gastrointestinal: Negative.   Genitourinary: Negative.   Musculoskeletal: Positive for myalgias.  Skin: Negative.   Neurological: Negative.   Endo/Heme/Allergies: Negative.   Psychiatric/Behavioral: Positive for depression. The patient is nervous/anxious and has insomnia.     Blood pressure 171/115, pulse 84, temperature 98.5 F (36.9 C), temperature source Oral, resp. rate 18, height 6' (1.829 m), weight 151.501 kg (334 lb).Body mass index is 45.29 kg/(m^2).  General Appearance: Casual  Eye Contact::  Fair  Speech:  Slow  Volume:  Decreased  Mood:  Anxious, Dysphoric and Irritable  Affect:  Constricted, Flat and Labile   Thought Process:  Circumstantial  Orientation:  Full (Time, Place, and Person)  Thought Content:  Rumination  Suicidal Thoughts:  No  Homicidal Thoughts:  Yes.  with intent/plan  Memory:  Immediate;   Fair Recent;   Fair Remote;   Fair  Judgement:  Impaired  Insight:  Lacking  Psychomotor Activity:  Decreased  Concentration:  Fair  Recall:  Fair  Akathisia:  No  Handed:  Right  AIMS (if indicated):     Assets:  Desire for Improvement Housing Social Support  Sleep:  Number of Hours: 5.25   Current Medications: Current Facility-Administered Medications  Medication Dose Route Frequency Provider Last Rate Last Dose  . acetaminophen (TYLENOL) tablet 650 mg  650 mg Oral Q6H PRN Rachael Fee, MD      . alum & mag hydroxide-simeth (MAALOX/MYLANTA) 200-200-20 MG/5ML suspension 30 mL  30 mL Oral Q4H PRN Rachael Fee, MD      . benzonatate (TESSALON) capsule 100 mg  100 mg Oral BID PRN Shuvon Rankin, NP   100 mg at 01/11/13 2205  . gabapentin (NEURONTIN) capsule 100 mg  100 mg Oral TID Shuvon Rankin, NP   100 mg at 01/14/13 0819  . hydrOXYzine (ATARAX/VISTARIL) tablet 50 mg  50 mg Oral QHS,MR X 1 Mojeed Akintayo   50 mg at 01/13/13 2309  . insulin aspart (novoLOG) injection 0-15 Units  0-15 Units Subcutaneous TID WC Verne Spurr, PA-C   2 Units at 01/14/13 0703  . levothyroxine (SYNTHROID, LEVOTHROID) tablet 25 mcg  25  mcg Oral QAC breakfast Shuvon Rankin, NP   25 mcg at 01/14/13 0703  . lisinopril (PRINIVIL,ZESTRIL) tablet 10 mg  10 mg Oral Daily Verne Spurr, PA-C   10 mg at 01/14/13 5784  . magnesium hydroxide (MILK OF MAGNESIA) suspension 30 mL  30 mL Oral Daily PRN Rachael Fee, MD      . metFORMIN (GLUCOPHAGE) tablet 500 mg  500 mg Oral BID WC Shuvon Rankin, NP   500 mg at 01/14/13 6962  . multivitamin with minerals tablet 1 tablet  1 tablet Oral Daily Rachael Fee, MD   1 tablet at 01/14/13 0820  . naproxen (NAPROSYN) tablet 250 mg  250 mg Oral BID WC Mojeed Akintayo   250 mg  at 01/14/13 0820  . oxyCODONE-acetaminophen (PERCOCET/ROXICET) 5-325 MG per tablet 1 tablet  1 tablet Oral Daily Nanine Means, NP   1 tablet at 01/13/13 1518  . pseudoephedrine (SUDAFED) 12 hr tablet 120 mg  120 mg Oral BID Kerry Hough, PA-C   120 mg at 01/14/13 9528  . thiamine (B-1) injection 100 mg  100 mg Intramuscular Once Rachael Fee, MD      . thiamine (VITAMIN B-1) tablet 100 mg  100 mg Oral Daily Rachael Fee, MD   100 mg at 01/14/13 4132  . ziprasidone (GEODON) capsule 40 mg  40 mg Oral Q breakfast Nanine Means, NP   40 mg at 01/14/13 4401  . ziprasidone (GEODON) capsule 60 mg  60 mg Oral Q supper Larena Sox, MD   60 mg at 01/13/13 1635  . zolpidem (AMBIEN) tablet 10 mg  10 mg Oral QHS PRN Katharine Rochefort, MD        Lab Results:  Results for orders placed during the hospital encounter of 01/07/13 (from the past 48 hour(s))  GLUCOSE, CAPILLARY     Status: Abnormal   Collection Time    01/12/13 11:38 AM      Result Value Range   Glucose-Capillary 206 (*) 70 - 99 mg/dL  GLUCOSE, CAPILLARY     Status: Abnormal   Collection Time    01/12/13  5:00 PM      Result Value Range   Glucose-Capillary 153 (*) 70 - 99 mg/dL  GLUCOSE, CAPILLARY     Status: Abnormal   Collection Time    01/12/13  9:07 PM      Result Value Range   Glucose-Capillary 123 (*) 70 - 99 mg/dL  GLUCOSE, CAPILLARY     Status: Abnormal   Collection Time    01/13/13 11:58 AM      Result Value Range   Glucose-Capillary 140 (*) 70 - 99 mg/dL   Comment 1 Notify RN    GLUCOSE, CAPILLARY     Status: Abnormal   Collection Time    01/13/13  4:57 PM      Result Value Range   Glucose-Capillary 140 (*) 70 - 99 mg/dL  GLUCOSE, CAPILLARY     Status: Abnormal   Collection Time    01/13/13  8:39 PM      Result Value Range   Glucose-Capillary 137 (*) 70 - 99 mg/dL  GLUCOSE, CAPILLARY     Status: Abnormal   Collection Time    01/14/13  6:42 AM      Result Value Range   Glucose-Capillary 148 (*) 70 - 99 mg/dL     Physical Findings: AIMS: Facial and Oral Movements Muscles of Facial Expression: None, normal Lips and Perioral Area: None, normal  Jaw: None, normal Tongue: None, normal,Extremity Movements Upper (arms, wrists, hands, fingers): None, normal Lower (legs, knees, ankles, toes): None, normal, Trunk Movements Neck, shoulders, hips: None, normal, Overall Severity Severity of abnormal movements (highest score from questions above): None, normal Incapacitation due to abnormal movements: None, normal Patient's awareness of abnormal movements (rate only patient's report): No Awareness, Dental Status Current problems with teeth and/or dentures?: Yes (Most teeth have been lost.) Does patient usually wear dentures?: No  CIWA:  CIWA-Ar Total: 4 COWS:     Treatment Plan Summary: Daily contact with patient to assess and evaluate symptoms and progress in treatment Medication management  Plan: Nursing Director Royal Hawthorn met with patient and explained why he could not be started on Seroquel. Patient became calmer at this point and able to listen to this physician. He will be started on Ambien at 10mg  to help with sleep. Patient urged to not nap during the day since it can interfere with quality sleep at night. Will discontinue mucinex. Patient urged to attend groups, discuss his emotions about his mother and learn to cope with his anger without resorting to violence.  Medical Decision Making Problem Points:  Established problem, stable/improving (1), Review of last therapy session (1) and Review of psycho-social stressors (1) Data Points:  Review of medication regiment & side effects (2) Review of new medications or change in dosage (2)  I certify that inpatient services furnished can reasonably be expected to improve the patient's condition.   Julian Evans 01/14/2013, 11:17 AM

## 2013-01-14 NOTE — Clinical Social Work Note (Signed)
  [facility] LCSW Group Therapy [facility]  1:15 - 2:30        01/14/2013   3:26 PM     Type of Therapy:  Group Therapy  Participation Level:  Did not attend group.  Wynn Banker 01/14/2013  3:26 PM

## 2013-01-14 NOTE — Progress Notes (Signed)
[facility] LCSW Aftercare Discharge Planning Group Note  01/14/2013 10:13 AM  Participation Quality: Patient did not get to participate in group as he entered just as group was ending.  Wynn Banker 01/14/2013, 10:13 AM

## 2013-01-14 NOTE — Progress Notes (Signed)
Patient ID: Julian Evans, male   DOB: 23-May-1961, 52 y.o.   MRN: [id number] D: Patient in room on approach. Pt presented with depressed mood and flat affect.  Pt was a bit cheerful today than he was a week ago. No complaints about his medications. When awoken to attend group pt stated "I'm tired and want to rest". Pt woke up to eat snack and allowed for a CBG stick. Pt denies SI/AVH and pain. Cooperative with assessment. No acute distressed noted at this time.   A: Met with pt 1:1. Medications administered as prescribed. Writer encouraged pt to discuss feelings. Pt encouraged to come to staff with any question or concerns.  R: Patient remains safe. He is complaint with medications. Continue current POC.

## 2013-01-14 NOTE — Progress Notes (Signed)
Psychoeducational Group Note  Date:  01/14/2013 Time:  1100  Group Topic/Focus:  Overcoming Stress:   The focus of this group is to define stress and help patients assess their triggers.  Participation Level: Did Not Attend  Participation Quality:  Not Applicable  Affect:  Not Applicable  Cognitive:  Not Applicable  Insight:  Not Applicable  Engagement in Group: Not Applicable  Additional Comments:  Patient did not attend group, and remained in bed.  Karleen Hampshire Brittini 01/14/2013, 7:14 PM

## 2013-01-14 NOTE — Clinical Social Work Note (Signed)
Writer met with patient prior to Aftercare Group.  He shared that he was upset with MD over medications and did not want to get near her.  Patient stated he feared what would happen if he were near her.  Patient was advised not to act out and that Clinical research associate would contact ADON for assistance.  Writer and DON met with patient following group to hear his concerns.  He shared he was upset that he is not getting medication he had been on prior to admission that had helped him to sleep.  DON advised patient of new FDA regulations on medications.  Patient was asked not to make threats against staff when he becomes upset and agreed he would not.  Writer advised patient we have been unable to contact mother at phone number he provided.  He assured Clinical research associate of phone number and advised mother's number is listed in the phone book.  Writer confirmed phone number as [phone].  Writer has attempted to call three times this morning but no answer or answering machine.

## 2013-01-14 NOTE — Progress Notes (Signed)
Patient ID: Julian Evans, male   DOB: 1960-12-21, 52 y.o.   MRN: [id number] D: Patient  presented with depressed mood and flat affect. Pt had no conplains about his medication tonight.   Pt denies SI/HI/AVH and pain. Pt attended evening karaoke and stated he danced too much that his legs hurt. Pt is up in the dayroom most of the evening interacting appropriately with peers and staff. Cooperative with assessment. No acute distressed noted at this time.   A: Met with pt 1:1. Medications administered as prescribed. Writer encouraged pt to discuss feelings. Pt encouraged to come to staff with any question or concerns.  R: Patient remains safe. He is compliant with medications and group programming. Continue current POC.

## 2013-01-14 NOTE — Progress Notes (Signed)
Pt attended Karaoke group; participated by singing 2 songs and was supportive of peers.

## 2013-01-14 NOTE — Progress Notes (Signed)
D: Earlier this morning, patient presented with an angry affect and agitation. Patient was upset that he had not received a sleep aide of his choice the night before. Patient verbalized that he was not able to rest well last night. After speaking with the director, MD and social worker regarding his concern, the patient is now much calmer and pleasant with staff.  A: Support and encouragement provided to patient. Scheduled medications administered per MD orders. Maintain Q15 minute checks for safety.  R: Patient receptive. Denies SI/AVH, but is HI towards his mother; contracts for safety. Patient remains safe on the unit.

## 2013-01-15 MED ORDER — METFORMIN HCL 500 MG PO TABS: 500.0000 mg | ORAL_TABLET | Freq: Two times a day (BID) | ORAL | Status: DC

## 2013-01-15 MED ORDER — LISINOPRIL 10 MG PO TABS: 10.0000 mg | ORAL_TABLET | Freq: Every day | ORAL | Status: AC

## 2013-01-15 MED ORDER — HYDROXYZINE HCL 50 MG PO TABS: 50.0000 mg | ORAL_TABLET | Freq: Every evening | ORAL | Status: DC | PRN

## 2013-01-15 MED ORDER — ZIPRASIDONE HCL 40 MG PO CAPS: 40.0000 mg | ORAL_CAPSULE | Freq: Every day | ORAL | Status: DC

## 2013-01-15 MED ORDER — GABAPENTIN 100 MG PO CAPS: 100.0000 mg | ORAL_CAPSULE | Freq: Three times a day (TID) | ORAL | Status: DC

## 2013-01-15 MED ORDER — LEVOTHYROXINE SODIUM 25 MCG PO TABS: 25.0000 ug | ORAL_TABLET | Freq: Every day | ORAL | Status: AC

## 2013-01-15 MED ORDER — ZIPRASIDONE HCL 60 MG PO CAPS: 60.0000 mg | ORAL_CAPSULE | Freq: Every day | ORAL | Status: DC

## 2013-01-15 NOTE — [facility] Suicide Risk Assessment (Signed)
Suicide Risk Assessment  Discharge Assessment     Demographic Factors:  Male, Caucasian, Low socioeconomic status and Living alone  Mental Status Per Nursing Assessment::   On Admission:  NA  Current Mental Status by Physician: Patient alert and oriented to 4. Denies AH/VH/SI/HI.  Loss Factors: Financial problems/change in socioeconomic status  Historical Factors: Family history of mental illness or substance abuse and Impulsivity  Risk Reduction Factors:   Positive social support  Continued Clinical Symptoms:  Depression:   Recent sense of peace/wellbeing  Cognitive Features That Contribute To Risk:  Thought constriction (tunnel vision)    Suicide Risk:  Minimal: No identifiable suicidal ideation.  Patients presenting with no risk factors but with morbid ruminations; may be classified as minimal risk based on the severity of the depressive symptoms  Discharge Diagnoses:   AXIS I:  Bereavement, Major Depression, Recurrent severe and Substance Abuse AXIS II:  No diagnosis AXIS III:   Past Medical History  Diagnosis Date  . Hypertension   . Hypothyroidism   . Mental disorder   . Diabetes mellitus without complication   . Hepatitis   . Cirrhosis of liver   . Herniated disc, cervical     per pt 6 herniated disk in his back causing moderate to severe pain    AXIS IV:  housing problems and other psychosocial or environmental problems AXIS V:  61-70 mild symptoms  Plan Of Care/Follow-up recommendations:  Activity:  as tolerated Diet:  low salt dietFollow up with outpatient appointments.  Is patient on multiple antipsychotic therapies at discharge:  No   Has Patient had three or more failed trials of antipsychotic monotherapy by history:  No  Recommended Plan for Multiple Antipsychotic Therapies: NA  Julian Evans 01/15/2013, 9:59 AM

## 2013-01-15 NOTE — Progress Notes (Signed)
Discharge Note  D: Patient affect brighter today, as opposed to other previous shifts; his mood was appropriate to the circumstance.   A: Support and encouragement provided to patient. Administered scheduled medications per ordering MD. Discharge instructions/prescriptions/medication samples given to patient. Returned belongings to patient.  R: Patient receptive. Denies SI/HI/AVH. Patient d/c without incident. Patient verbalized understanding of discharge instructions and prescriptions.

## 2013-01-15 NOTE — Progress Notes (Signed)
[facility] Adult Case Management Discharge Plan :  Will you be returning to the same living situation after discharge: Yes,  Patient will return to his home. At discharge, do you have transportation home?:Yes,  Patient to arrange is own transportation home. Do you have the ability to pay for your medications:Yes,  Patient has Medicaid  Release of information consent forms completed and in the chart;  Patient's signature needed at discharge.  Patient to Follow up at: Follow-up Information   Follow up with [facility] On 01/19/2013. (You are scheduled with [facility] on Tuesday, January 19, 2013 at 8:30 AM.   Referral # [id number])    Contact information:   [address] [place], [place]   [address]  [phone]      Follow up with Dr. Helyn Numbers. (Thursday, January 28, 2013 at 11:00 am)    Contact information:   [address] [place], [place]   [address]  [phone]      Patient denies SI/HI:   Yes,  Patient denies SI/HI or thoughts of self harm  Patient is no longer endorsing HI toward mother.  He stated he is now stable on medications and is thinking clearly.  Safety Planning and Suicide Prevention discussed:  Yes, reviewed during aftercare group.  Wynn Banker 01/15/2013, 10:05 AM

## 2013-01-15 NOTE — Progress Notes (Signed)
Adult Psychoeducational Group Note  Date:  01/15/2013 Time:  3:02 PM  Group Topic/Focus:  Early Warning Signs:   The focus of this group is to help patients identify signs or symptoms they exhibit before slipping into an unhealthy state or crisis.  Participation Level:  Did Not Attend  Additional Comments:  Pt did not attend group due to preparing for discharge.  Reinaldo Raddle K 01/15/2013, 3:02 PM

## 2013-01-15 NOTE — Discharge Summary (Signed)
Physician Discharge Summary Note  Patient:  Julian Evans is an 52 y.o., male MRN:  [id number] DOB:  1961/06/11 Patient phone:  [phone] (home)  Patient address:   [address] [place] [address],   Date of Admission:  01/07/2013 Date of Discharge: 01/15/2013  Reason for Admission:  Depressed with homicidal thoughts toward his mother  Discharge Diagnoses: Active Problems:   Bipolar I disorder, most recent episode depressed   PTSD (post-traumatic stress disorder)  Review of Systems  Constitutional: Negative.   HENT: Negative.   Eyes: Negative.   Respiratory: Negative.   Cardiovascular: Negative.   Gastrointestinal: Negative.   Genitourinary: Negative.   Musculoskeletal: Negative.   Skin: Negative.   Neurological: Negative.   Endo/Heme/Allergies: Negative.   Psychiatric/Behavioral: Positive for substance abuse. The patient is nervous/anxious.    Axis Diagnosis:   AXIS I:  Anxiety Disorder NOS, Mood Disorder NOS, Substance Abuse and Substance Induced Mood Disorder AXIS II:  Deferred AXIS III:   Past Medical History  Diagnosis Date  . Hypertension   . Hypothyroidism   . Mental disorder   . Diabetes mellitus without complication   . Hepatitis   . Cirrhosis of liver   . Herniated disc, cervical     per pt 6 herniated disk in his back causing moderate to severe pain    AXIS IV:  housing problems, other psychosocial or environmental problems, problems related to social environment and problems with primary support group AXIS V:  61-70 mild symptoms  Level of Care:  OP  Hospital Course:  On admission:  52 y.o. male who presented as a walk-in. Per [facility] assessment, patient had reported that he does not want to go back to prison and that he is hearing voices but is reluctant to disclose what they are telling him to do. He says he is seeing things but will not say what. Reluctantly, he admits to having thoughts of hurting his mother, and will not disclose a plan. Has  unsecured firearms in the home. Denies SI. Has a distant history of heroin and cocaine use, although he says he has used none in nine years. He does admit to daily marijuana use and drinking alcohol several times a week. He has an extensive criminal record with multiple charges, including assault on a male, assault with a deadly weapon, and assault on a government official. He says he was in prison for twenty years. He denies any current legal charges or pending court dates. He says he has been traveling out of state and has not had any medication since December 2013. He says he was on Geodon before he stopped taking his medication. He is currently not displaying any withdrawal symptoms, although he did admit to having drank two fifths of liquor 3-4 hours before coming here to be assessed. This morning patient is lying in bed sleeping, states he does not want to be disturbed. Has a cold and not feeling well. He endorses being depressed, denies SI, but admits to homicidal thoughts of hurting someone outside the hospital.  During hospitalization his medications were:  Gabapentin 100 mg TID for neuropathic pain, vistaril 50 mg for anxiety, synthroid 25 mcg for hypothyroidism, lisinopril 10 mg for HTN, glucophage 500 mg BID for DM II, Geodon 40 mg in am and 80 mg in pm for psychosis.  Patient calmed down significantly and no longer has homicidal ideation toward his mother, concerned about her yesterday when the social worker could not get in touch with her.  BP fluctuates and will follow-up with his physician team at [facility] for his multiple medical issues.  Tung attended and participated in most groups and developed coping skills for his anger issues.  Patient denied suicidal/homicidal ideations and auditory/visual hallucinations, follow-up appointments encouraged to attend, Rx and 3 day supply of medications given at discharge.  Aviel is mentally and physically stable for discharge.   Consults:   None  Significant Diagnostic Studies:  labs: Completed and reviewed, stable  Discharge Vitals:   Blood pressure 153/100, pulse 84, temperature 98.1 F (36.7 C), temperature source Oral, resp. rate 24, height 6' (1.829 m), weight 151.501 kg (334 lb). Body mass index is 45.29 kg/(m^2). Lab Results:   Results for orders placed during the hospital encounter of 01/07/13 (from the past 72 hour(s))  GLUCOSE, CAPILLARY     Status: Abnormal   Collection Time    01/12/13 11:38 AM      Result Value Range   Glucose-Capillary 206 (*) 70 - 99 mg/dL  GLUCOSE, CAPILLARY     Status: Abnormal   Collection Time    01/12/13  5:00 PM      Result Value Range   Glucose-Capillary 153 (*) 70 - 99 mg/dL  GLUCOSE, CAPILLARY     Status: Abnormal   Collection Time    01/12/13  9:07 PM      Result Value Range   Glucose-Capillary 123 (*) 70 - 99 mg/dL  GLUCOSE, CAPILLARY     Status: Abnormal   Collection Time    01/13/13 11:58 AM      Result Value Range   Glucose-Capillary 140 (*) 70 - 99 mg/dL   Comment 1 Notify RN    GLUCOSE, CAPILLARY     Status: Abnormal   Collection Time    01/13/13  4:57 PM      Result Value Range   Glucose-Capillary 140 (*) 70 - 99 mg/dL  GLUCOSE, CAPILLARY     Status: Abnormal   Collection Time    01/13/13  8:39 PM      Result Value Range   Glucose-Capillary 137 (*) 70 - 99 mg/dL  GLUCOSE, CAPILLARY     Status: Abnormal   Collection Time    01/14/13  6:42 AM      Result Value Range   Glucose-Capillary 148 (*) 70 - 99 mg/dL  GLUCOSE, CAPILLARY     Status: Abnormal   Collection Time    01/14/13 11:53 AM      Result Value Range   Glucose-Capillary 114 (*) 70 - 99 mg/dL   Comment 1 Notify RN    GLUCOSE, CAPILLARY     Status: Abnormal   Collection Time    01/14/13  5:02 PM      Result Value Range   Glucose-Capillary 119 (*) 70 - 99 mg/dL   Comment 1 Notify RN    GLUCOSE, CAPILLARY     Status: Abnormal   Collection Time    01/14/13  9:28 PM      Result Value Range    Glucose-Capillary 183 (*) 70 - 99 mg/dL   Comment 1 Notify RN      Physical Findings: AIMS: Facial and Oral Movements Muscles of Facial Expression: None, normal Lips and Perioral Area: None, normal Jaw: None, normal Tongue: None, normal,Extremity Movements Upper (arms, wrists, hands, fingers): None, normal Lower (legs, knees, ankles, toes): None, normal, Trunk Movements Neck, shoulders, hips: None, normal, Overall Severity Severity of abnormal movements (highest score from questions above): None, normal Incapacitation due  to abnormal movements: None, normal Patient's awareness of abnormal movements (rate only patient's report): No Awareness, Dental Status Current problems with teeth and/or dentures?: Yes (Most teeth have been lost.) Does patient usually wear dentures?: No  CIWA:  CIWA-Ar Total: 4 COWS:     Psychiatric Specialty Exam: See Psychiatric Specialty Exam and Suicide Risk Assessment completed by Attending Physician prior to discharge.  Discharge destination:  Home  Is patient on multiple antipsychotic therapies at discharge:  No   Has Patient had three or more failed trials of antipsychotic monotherapy by history:  No Recommended Plan for Multiple Antipsychotic Therapies:  N/A  Discharge Orders   Future Orders Complete By Expires     Activity as tolerated - No restrictions  As directed     Diet - low sodium heart healthy  As directed         Medication List    TAKE these medications     Indication   gabapentin 100 MG capsule  Commonly known as:  NEURONTIN  Take 1 capsule (100 mg total) by mouth 3 (three) times daily.   Indication:  Neuropathic Pain     hydrOXYzine 50 MG tablet  Commonly known as:  ATARAX/VISTARIL  Take 1 tablet (50 mg total) by mouth at bedtime and may repeat dose one time if needed.   Indication:  Anxiety Neurosis     levothyroxine 25 MCG tablet  Commonly known as:  SYNTHROID, LEVOTHROID  Take 1 tablet (25 mcg total) by mouth daily  before breakfast.   Indication:  Underactive Thyroid     lisinopril 10 MG tablet  Commonly known as:  PRINIVIL,ZESTRIL  Take 1 tablet (10 mg total) by mouth daily.   Indication:  High Blood Pressure     metFORMIN 500 MG tablet  Commonly known as:  GLUCOPHAGE  Take 1 tablet (500 mg total) by mouth 2 (two) times daily with a meal.   Indication:  Type 2 Diabetes     oxycodone-acetaminophen 5-500 MG per tablet  Commonly known as:  ROXICET  Take 1-2 tablets by mouth every 6 (six) hours as needed for pain.      ziprasidone 40 MG capsule  Commonly known as:  GEODON  Take 1 capsule (40 mg total) by mouth daily with breakfast.   Indication:  Manic-Depression     ziprasidone 60 MG capsule  Commonly known as:  GEODON  Take 1 capsule (60 mg total) by mouth daily with supper.   Indication:  mood stability           Follow-up Information   Follow up with [facility] On 01/19/2013. (You are scheduled with [facility] on Tuesday, January 19, 2013 at 8:30 AM.   Referral # [id number])    Contact information:   [address] [place], [place]   [address]  [phone]      Follow up with Dr. Helyn Numbers. (Thursday, January 28, 2013 at 11:00 am)    Contact information:   [address] [place], [place]   [address]  [phone]      Follow-up recommendations:  Activity:  As tolerated Diet:  Low-sodium heart healthy diet  Comments:  Patient will follow-up with [facility] for his care.  Total Discharge Time:  Greater than 30 minutes.  SignedNanine Means, PMH-NP 01/15/2013, 10:05 AM

## 2013-01-15 NOTE — Tx Team (Addendum)
Interdisciplinary Treatment Plan Update   Date Reviewed:  01/15/2013  Time Reviewed:  9:44 AM  Progress in Treatment:   Attending groups: Yes Participating in groups: Yes Taking medication as prescribed: Yes  Tolerating medication: Yes Family/Significant other contact made: No.  Patient reports no family involvement.  Patient understands diagnosis: Yes  Discussing patient identified problems/goals with staff: Yes Medical problems stabilized or resolved: Yes Denies suicidal/homicidal ideation: Yes,  Patient is not longer endorsing SI/HI  Patient has not harmed self or others: Yes  For review of initial/current patient goals, please see plan of care.  Estimated Length of Stay:  Discharge home today.  Reasons for Continued Hospitalization:   New Problems/Goals identified:    Discharge Plan or Barriers:   Home with outpatient follow up at [facility]  Additional Comments:  Patient is no longer endorsing SI/HI.  Patient advised of being stable on medications and no longer having HI toward his mother.  He rates depression anxiety at eight and other symptoms at six. Due to patient no longer endorsing HI, MD and treatment agree there is no duty to warn.  Attendees:  Patient:   Julian Evans 01/15/2013 9:44 AM   Signature: Patrick North, MD 01/15/2013 9:44 AM  Signature:Jeanie Brooke Dare,  RN 01/15/2013 9:44 AM  Signature: Harold Barban, RN 01/15/2013 9:44 AM  Signature: 01/15/2013 9:44 AM  Signature: 01/15/2013 9:44 AM  Signature:  Juline Patch, LCSW 01/15/2013 9:44 AM  Signature: Silverio Decamp, PMH-NP 01/15/2013 9:44 AM  Signature: 01/15/2013 9:44 AM  Signature: 01/15/2013 9:44 AM  Signature:    Signature:    Signature:      Scribe for Treatment Team:   Juline Patch,  01/15/2013 9:44 AM

## 2013-01-20 NOTE — Progress Notes (Signed)
Patient Discharge Instructions:  After Visit Summary (AVS):   Faxed to:  01/20/13 Discharge Summary Note:   Faxed to:  01/20/13 Psychiatric Admission Assessment Note:   Faxed to:  01/20/13 Suicide Risk Assessment - Discharge Assessment:   Faxed to:  01/20/13 Faxed/Sent to the Next Level Care provider:  01/20/13 Faxed to [facility] @ [phone] Records sent via mail to: Oval Linsey, MD [address] [place], [place] [address]  Jerelene Redden, 01/20/2013, 1:44 PM

## 2015-08-03 ENCOUNTER — Other Ambulatory Visit (HOSPITAL_COMMUNITY): Payer: Self-pay | Admitting: Family Medicine

## 2015-08-03 DIAGNOSIS — G43911 Migraine, unspecified, intractable, with status migrainosus: Secondary | ICD-10-CM

## 2015-08-08 ENCOUNTER — Ambulatory Visit (HOSPITAL_COMMUNITY): Admission: RE | Admit: 2015-08-08 | Payer: Medicaid Other | Source: Ambulatory Visit

## 2015-08-16 ENCOUNTER — Ambulatory Visit (HOSPITAL_COMMUNITY)
Admission: RE | Admit: 2015-08-16 | Discharge: 2015-08-16 | Disposition: A | Discharge status: Home / Self Care | Payer: Medicaid Other | Source: Ambulatory Visit | Attending: Family Medicine | Admitting: Family Medicine

## 2015-08-16 DIAGNOSIS — G43911 Migraine, unspecified, intractable, with status migrainosus: Secondary | ICD-10-CM | POA: Insufficient documentation

## 2015-08-16 DIAGNOSIS — Z87828 Personal history of other (healed) physical injury and trauma: Secondary | ICD-10-CM | POA: Insufficient documentation

## 2016-01-18 ENCOUNTER — Encounter (HOSPITAL_COMMUNITY): Payer: Self-pay

## 2016-01-18 ENCOUNTER — Emergency Department (HOSPITAL_COMMUNITY)
Admission: EM | Admit: 2016-01-18 | Discharge: 2016-01-19 | Disposition: A | Discharge status: Home / Self Care | Payer: Medicaid Other | Attending: Emergency Medicine | Admitting: Emergency Medicine

## 2016-01-18 DIAGNOSIS — Z794 Long term (current) use of insulin: Secondary | ICD-10-CM | POA: Diagnosis not present

## 2016-01-18 DIAGNOSIS — E1165 Type 2 diabetes mellitus with hyperglycemia: Secondary | ICD-10-CM | POA: Diagnosis present

## 2016-01-18 DIAGNOSIS — I10 Essential (primary) hypertension: Secondary | ICD-10-CM | POA: Insufficient documentation

## 2016-01-18 DIAGNOSIS — E039 Hypothyroidism, unspecified: Secondary | ICD-10-CM | POA: Insufficient documentation

## 2016-01-18 DIAGNOSIS — Z79899 Other long term (current) drug therapy: Secondary | ICD-10-CM | POA: Diagnosis not present

## 2016-01-18 DIAGNOSIS — R739 Hyperglycemia, unspecified: Secondary | ICD-10-CM

## 2016-01-18 LAB — CBC WITH DIFFERENTIAL/PLATELET
BASOS PCT: 0 %
Basophils Absolute: 0 10*3/uL (ref 0.0–0.1)
EOS ABS: 0.1 10*3/uL (ref 0.0–0.7)
Eosinophils Relative: 1 %
HCT: 46.6 % (ref 39.0–52.0)
HEMOGLOBIN: 15.8 g/dL (ref 13.0–17.0)
LYMPHS PCT: 21 %
Lymphs Abs: 3 10*3/uL (ref 0.7–4.0)
MCH: 30.6 pg (ref 26.0–34.0)
MCHC: 33.9 g/dL (ref 30.0–36.0)
MCV: 90.3 fL (ref 78.0–100.0)
MONOS PCT: 8 %
Monocytes Absolute: 1.1 10*3/uL — ABNORMAL HIGH (ref 0.1–1.0)
NEUTROS ABS: 10.1 10*3/uL — AB (ref 1.7–7.7)
NEUTROS PCT: 70 %
Platelets: 261 10*3/uL (ref 150–400)
RBC: 5.16 MIL/uL (ref 4.22–5.81)
RDW: 13.7 % (ref 11.5–15.5)
WBC: 14.4 10*3/uL — ABNORMAL HIGH (ref 4.0–10.5)

## 2016-01-18 LAB — COMPREHENSIVE METABOLIC PANEL
ALBUMIN: 5 g/dL (ref 3.5–5.0)
ALK PHOS: 83 U/L (ref 38–126)
ALT: 12 U/L — AB (ref 17–63)
ANION GAP: 9 (ref 5–15)
AST: 16 U/L (ref 15–41)
BUN: 16 mg/dL (ref 6–20)
CALCIUM: 9.7 mg/dL (ref 8.9–10.3)
CO2: 27 mmol/L (ref 22–32)
CREATININE: 1.23 mg/dL (ref 0.61–1.24)
Chloride: 98 mmol/L — ABNORMAL LOW (ref 101–111)
GFR calc Af Amer: >60 mL/min (ref >60)
GFR calc non Af Amer: >60 mL/min (ref >60)
GLUCOSE: 409 mg/dL — AB (ref 65–99)
Potassium: 4 mmol/L (ref 3.5–5.1)
SODIUM: 134 mmol/L — AB (ref 135–145)
Total Bilirubin: 0.8 mg/dL (ref 0.3–1.2)
Total Protein: 8.7 g/dL — ABNORMAL HIGH (ref 6.5–8.1)

## 2016-01-18 MED ORDER — SODIUM CHLORIDE 0.9 % IV BOLUS (SEPSIS)
1000.0000 mL | Freq: Once | INTRAVENOUS | Status: AC
  Administered 2016-01-18: 1000 mL via INTRAVENOUS

## 2016-01-18 NOTE — ED Notes (Signed)
I feel like my blood sugar is up at this time. I did not have a change to take my insulin before I was arrested.  I have not taken any of my medications today per pt.  Blood sugar was over 300 at the jail.

## 2016-01-18 NOTE — ED Provider Notes (Signed)
TIME SEEN: 12:39 AM    CHIEF COMPLAINT: Hyperglycemia   HPI:  HPI Comments: Julian Evans is a 55 y.o. male, with a h/o HTN and DM, who presents to the Emergency Department for evaluation of hyperglycemia. Pt states he was about to take his diabetic medication this evening when he was arrested and therefore unable to take his 60mg  of Toujeo, which he takes every night before bed. Per police, the pt was vomiting at the jail with a CBG reading over 300. He has no other complaints this evening and states he just needs his insulin dose and medication. Pt has not taken any of his medications today, including BP and thyroid medication. No h/o DKA.    ROS: See HPI Constitutional: no fever  Eyes: no drainage  ENT: no runny nose   Cardiovascular:  no chest pain  Resp: no SOB  GI: no vomiting GU: no dysuria Integumentary: no rash  Allergy: no hives  Musculoskeletal: no leg swelling  Neurological: no slurred speech ROS otherwise negative  PAST MEDICAL HISTORY/PAST SURGICAL HISTORY:  Past Medical History  Diagnosis Date  . Hypertension   . Hypothyroidism   . Mental disorder   . Diabetes mellitus without complication (HCC)   . Hepatitis   . Cirrhosis of liver (HCC)   . Herniated disc, cervical     per pt 6 herniated disk in his back causing moderate to severe pain     MEDICATIONS:  Prior to Admission medications   Medication Sig Start Date End Date Taking? Authorizing Provider  amLODipine-olmesartan (AZOR) 10-40 MG tablet Take 1 tablet by mouth daily.  03/06/11  Yes Historical Provider, MD  atorvastatin (LIPITOR) 40 MG tablet Take 40 mg by mouth daily.  03/06/11  Yes Historical Provider, MD  Insulin Glargine (TOUJEO SOLOSTAR) 300 UNIT/ML SOPN Inject 60 Units into the skin at bedtime.   Yes Historical Provider, MD  oxyCODONE-acetaminophen (PERCOCET) 10-325 MG tablet Take 1 tablet by mouth every 4 (four) hours as needed for pain.   Yes Historical Provider, MD  QUEtiapine (SEROQUEL) 400  MG tablet Take 400 mg by mouth at bedtime. 09/17/11  Yes Historical Provider, MD  gabapentin (NEURONTIN) 100 MG capsule Take 1 capsule (100 mg total) by mouth 3 (three) times daily. 01/15/13   Charm RingsJamison Y Lord, NP  hydrOXYzine (ATARAX/VISTARIL) 50 MG tablet Take 1 tablet (50 mg total) by mouth at bedtime and may repeat dose one time if needed. 01/15/13   Charm RingsJamison Y Lord, NP  levothyroxine (SYNTHROID, LEVOTHROID) 25 MCG tablet Take 1 tablet (25 mcg total) by mouth daily before breakfast. 01/15/13   Charm RingsJamison Y Lord, NP  lisinopril (PRINIVIL,ZESTRIL) 10 MG tablet Take 1 tablet (10 mg total) by mouth daily. 01/15/13   Charm RingsJamison Y Lord, NP    ALLERGIES:  Allergies  Allergen Reactions  . Mellaril [Thioridazine] Anaphylaxis  . Thorazine [Chlorpromazine] Anaphylaxis    SOCIAL HISTORY:  Social History  Substance Use Topics  . Smoking status: Never Smoker   . Smokeless tobacco: Not on file  . Alcohol Use: 2.4 oz/week    4 Cans of beer per week    FAMILY HISTORY: No family history on file.  EXAM: BP 159/93 mmHg  Pulse 58  Temp(Src) 97.7 F (36.5 C) (Oral)  Resp 20  SpO2 97% CONSTITUTIONAL: Alert and oriented and responds appropriately to questions. Well-appearing; well-nourished HEAD: Normocephalic EYES: Conjunctivae clear, PERRL ENT: normal nose; no rhinorrhea; moist mucous membranes NECK: Supple, no meningismus, no LAD  CARD: RRR; S1 and  S2 appreciated; no murmurs, no clicks, no rubs, no gallops RESP: Normal chest excursion without splinting or tachypnea; breath sounds clear and equal bilaterally; no wheezes, no rhonchi, no rales, no hypoxia or respiratory distress, speaking full sentences ABD/GI: Normal bowel sounds; non-distended; soft, non-tender, no rebound, no guarding, no peritoneal signs BACK:  The back appears normal and is non-tender to palpation, there is no CVA tenderness EXT: Normal ROM in all joints; non-tender to palpation; no edema; normal capillary refill; no cyanosis, no calf  tenderness or swelling    SKIN: Normal color for age and race; warm; no rash NEURO: Moves all extremities equally, sensation to light touch intact diffusely, cranial nerves II through XII intact PSYCH: The patient's mood and manner are appropriate. Grooming and personal hygiene are appropriate.  MEDICAL DECISION MAKING: Patient here with hyperglycemia. Otherwise asymptomatic. States that he was supposed to take his glargine 60 units at bedtime but was arrested prior to be able to do this. Also reports he has not had any of his other medications including blood pressure medication, cholesterol medication, thyroid medication. We'll give him his home medications in the emergency department. Blood glucose is improving with IV hydration. His bicarbonate is 27 with an anion gap of 9. He is not in DKA. He does have a mild leukocytosis with left shift but denies any recent infectious symptoms. No fever, cough, vomiting or diarrhea, dysuria, rash.  ED PROGRESS: Patient's blood glucose continues to improve. He is however hypertensive which I suspect is from him missing his blood pressure medication tonight. He has been given his blood pressure medication and we will monitor his blood pressure in the ED. He is still asymptomatic.    His blood pressure has improved. He will be discharged in custody with the sheriff's department. I have advised them that he should be given all of his medications as prescribed otherwise he will need to come right back to the hospital for hypertension and hyperglycemia. Discussed return precautions. They verbalize understanding and are comfortable with this plan.   I do not feel there is any life-threatening condition present. I have reviewed and discussed all results (EKG, imaging, lab, urine as appropriate), exam findings with patient. I have reviewed nursing notes and appropriate previous records.  I feel the patient is safe to be discharged home without further emergent workup.  Discussed usual and customary return precautions. Patient and family (if present) verbalize understanding and are comfortable with this plan.  Patient will follow-up with their primary care provider. If they do not have a primary care provider, information for follow-up has been provided to them. All questions have been answered.   I personally performed the services described in this documentation, which was scribed in my presence. The recorded information has been reviewed and is accurate.    Layla Maw Ward, DO 01/19/16 [phone]

## 2016-01-18 NOTE — ED Notes (Signed)
Was vomiting at the jail per officer.

## 2016-01-19 LAB — CBG MONITORING, ED
GLUCOSE-CAPILLARY: 385 mg/dL — AB (ref 65–99)
GLUCOSE-CAPILLARY: 279 mg/dL — AB (ref 65–99)
Glucose-Capillary: 303 mg/dL — ABNORMAL HIGH (ref 65–99)

## 2016-01-19 MED ORDER — QUETIAPINE FUMARATE 100 MG PO TABS
400.0000 mg | ORAL_TABLET | Freq: Once | ORAL | Status: AC
  Administered 2016-01-19: 400 mg via ORAL
  Filled 2016-01-19: qty 4

## 2016-01-19 MED ORDER — GABAPENTIN 100 MG PO CAPS
100.0000 mg | ORAL_CAPSULE | Freq: Once | ORAL | Status: AC
  Administered 2016-01-19: 100 mg via ORAL
  Filled 2016-01-19: qty 1

## 2016-01-19 MED ORDER — INSULIN GLARGINE 100 UNIT/ML ~~LOC~~ SOLN
60.0000 [IU] | Freq: Once | SUBCUTANEOUS | Status: AC
  Administered 2016-01-19: 60 [IU] via SUBCUTANEOUS
  Filled 2016-01-19: qty 0.6

## 2016-01-19 MED ORDER — LEVOTHYROXINE SODIUM 50 MCG PO TABS
25.0000 ug | ORAL_TABLET | Freq: Once | ORAL | Status: AC
  Administered 2016-01-19: 25 ug via ORAL
  Filled 2016-01-19: qty 1

## 2016-01-19 MED ORDER — LISINOPRIL 10 MG PO TABS
10.0000 mg | ORAL_TABLET | Freq: Once | ORAL | Status: AC
  Administered 2016-01-19: 10 mg via ORAL
  Filled 2016-01-19: qty 1

## 2016-01-19 MED ORDER — IRBESARTAN 300 MG PO TABS: 300.0000 mg | ORAL_TABLET | Freq: Every day | ORAL | Status: DC

## 2016-01-19 MED ORDER — SODIUM CHLORIDE 0.9 % IV BOLUS (SEPSIS)
1000.0000 mL | Freq: Once | INTRAVENOUS | Status: AC
  Administered 2016-01-19: 1000 mL via INTRAVENOUS

## 2016-01-19 MED ORDER — AMLODIPINE BESYLATE 5 MG PO TABS
10.0000 mg | ORAL_TABLET | Freq: Once | ORAL | Status: AC
  Administered 2016-01-19: 10 mg via ORAL
  Filled 2016-01-19: qty 2

## 2016-01-19 NOTE — Discharge Instructions (Signed)
Hyperglycemia °Hyperglycemia occurs when the glucose (sugar) in your blood is too high. Hyperglycemia can happen for many reasons, but it most often happens to people who do not know they have diabetes or are not managing their diabetes properly.  °CAUSES  °Whether you have diabetes or not, there are other causes of hyperglycemia. Hyperglycemia can occur when you have diabetes, but it can also occur in other situations that you might not be as aware of, such as: °Diabetes °· If you have diabetes and are having problems controlling your blood glucose, hyperglycemia could occur because of some of the following reasons: °· Not following your meal plan. °· Not taking your diabetes medications or not taking it properly. °· Exercising less or doing less activity than you normally do. °· Being sick. °Pre-diabetes °· This cannot be ignored. Before people develop Type 2 diabetes, they almost always have "pre-diabetes." This is when your blood glucose levels are higher than normal, but not yet high enough to be diagnosed as diabetes. Research has shown that some long-term damage to the body, especially the heart and circulatory system, may already be occurring during pre-diabetes. If you take action to manage your blood glucose when you have pre-diabetes, you may delay or prevent Type 2 diabetes from developing. °Stress °· If you have diabetes, you may be "diet" controlled or on oral medications or insulin to control your diabetes. However, you may find that your blood glucose is higher than usual in the hospital whether you have diabetes or not. This is often referred to as "stress hyperglycemia." Stress can elevate your blood glucose. This happens because of hormones put out by the body during times of stress. If stress has been the cause of your high blood glucose, it can be followed regularly by your caregiver. That way he/she can make sure your hyperglycemia does not continue to get worse or progress to  diabetes. °Steroids °· Steroids are medications that act on the infection fighting system (immune system) to block inflammation or infection. One side effect can be a rise in blood glucose. Most people can produce enough extra insulin to allow for this rise, but for those who cannot, steroids make blood glucose levels go even higher. It is not unusual for steroid treatments to "uncover" diabetes that is developing. It is not always possible to determine if the hyperglycemia will go away after the steroids are stopped. A special blood test called an A1c is sometimes done to determine if your blood glucose was elevated before the steroids were started. °SYMPTOMS °· Thirsty. °· Frequent urination. °· Dry mouth. °· Blurred vision. °· Tired or fatigue. °· Weakness. °· Sleepy. °· Tingling in feet or leg. °DIAGNOSIS  °Diagnosis is made by monitoring blood glucose in one or all of the following ways: °· A1c test. This is a chemical found in your blood. °· Fingerstick blood glucose monitoring. °· Laboratory results. °TREATMENT  °First, knowing the cause of the hyperglycemia is important before the hyperglycemia can be treated. Treatment may include, but is not be limited to: °· Education. °· Change or adjustment in medications. °· Change or adjustment in meal plan. °· Treatment for an illness, infection, etc. °· More frequent blood glucose monitoring. °· Change in exercise plan. °· Decreasing or stopping steroids. °· Lifestyle changes. °HOME CARE INSTRUCTIONS  °· Test your blood glucose as directed. °· Exercise regularly. Your caregiver will give you instructions about exercise. Pre-diabetes or diabetes which comes on with stress is helped by exercising. °· Eat wholesome,   balanced meals. Eat often and at regular, fixed times. Your caregiver or nutritionist will give you a meal plan to guide your sugar intake. °· Being at an ideal weight is important. If needed, losing as little as 10 to 15 pounds may help improve blood  glucose levels. °SEEK MEDICAL CARE IF:  °· You have questions about medicine, activity, or diet. °· You continue to have symptoms (problems such as increased thirst, urination, or weight gain). °SEEK IMMEDIATE MEDICAL CARE IF:  °· You are vomiting or have diarrhea. °· Your breath smells fruity. °· You are breathing faster or slower. °· You are very sleepy or incoherent. °· You have numbness, tingling, or pain in your feet or hands. °· You have chest pain. °· Your symptoms get worse even though you have been following your caregiver's orders. °· If you have any other questions or concerns. °  °This information is not intended to replace advice given to you by your health care provider. Make sure you discuss any questions you have with your health care provider. °  °Document Released: 04/09/2001 Document Revised: 01/06/2012 Document Reviewed: 06/20/2015 °Elsevier Interactive Patient Education ©2016 [employer] ° °Blood Glucose Monitoring, Adult °Monitoring your blood glucose (also know as blood sugar) helps you to manage your diabetes. It also helps you and your health care provider monitor your diabetes and determine how well your treatment plan is working. °WHY SHOULD YOU MONITOR YOUR BLOOD GLUCOSE? °· It can help you understand how food, exercise, and medicine affect your blood glucose. °· It allows you to know what your blood glucose is at any given moment. You can quickly tell if you are having low blood glucose (hypoglycemia) or high blood glucose (hyperglycemia). °· It can help you and your health care provider know how to adjust your medicines. °· It can help you understand how to manage an illness or adjust medicine for exercise. °WHEN SHOULD YOU TEST? °Your health care provider will help you decide how often you should check your blood glucose. This may depend on the type of diabetes you have, your diabetes control, or the types of medicines you are taking. Be sure to write down all of your blood glucose  readings so that this information can be reviewed with your health care provider. See below for examples of testing times that your health care provider may suggest. °Type 1 Diabetes °· Test at least 2 times per day if your diabetes is well controlled, if you are using an insulin pump, or if you perform multiple daily injections. °· If your diabetes is not well controlled or if you are sick, you may need to test more often. °· It is a good idea to also test: °¨ Before every insulin injection. °¨ Before and after exercise. °¨ Between meals and 2 hours after a meal. °¨ Occasionally between 2:00 a.m. and 3:00 a.m. °Type 2 Diabetes °· If you are taking insulin, test at least 2 times per day. However, it is best to test before every insulin injection. °· If you take medicines by mouth (orally), test 2 times a day. °· If you are on a controlled diet, test once a day. °· If your diabetes is not well controlled or if you are sick, you may need to monitor more often. °HOW TO MONITOR YOUR BLOOD GLUCOSE °Supplies Needed °· Blood glucose meter. °· Test strips for your meter. Each meter has its own strips. You must use the strips that go with your own meter. °· A   pricking needle (lancet). °· A device that holds the lancet (lancing device). °· A journal or log book to write down your results. °Procedure °· Wash your hands with soap and water. Alcohol is not preferred. °· Prick the side of your finger (not the tip) with the lancet. °· Gently milk the finger until a small drop of blood appears. °· Follow the instructions that come with your meter for inserting the test strip, applying blood to the strip, and using your blood glucose meter. °Other Areas to Get Blood for Testing °Some meters allow you to use other areas of your body (other than your finger) to test your blood. These areas are called alternative sites. The most common alternative sites are: °· The forearm. °· The thigh. °· The back area of the lower leg. °· The palm  of the hand. °The blood flow in these areas is slower. Therefore, the blood glucose values you get may be delayed, and the numbers are different from what you would get from your fingers. Do not use alternative sites if you think you are having hypoglycemia. Your reading will not be accurate. Always use a finger if you are having hypoglycemia. Also, if you cannot feel your lows (hypoglycemia unawareness), always use your fingers for your blood glucose checks. °ADDITIONAL TIPS FOR GLUCOSE MONITORING °· Do not reuse lancets. °· Always carry your supplies with you. °· All blood glucose meters have a 24-hour "hotline" number to call if you have questions or need help. °· Adjust (calibrate) your blood glucose meter with a control solution after finishing a few boxes of strips. °BLOOD GLUCOSE RECORD KEEPING °It is a good idea to keep a daily record or log of your blood glucose readings. Most glucose meters, if not all, keep your glucose records stored in the meter. Some meters come with the ability to download your records to your home computer. Keeping a record of your blood glucose readings is especially helpful if you are wanting to look for patterns. Make notes to go along with the blood glucose readings because you might forget what happened at that exact time. Keeping good records helps you and your health care provider to work together to achieve good diabetes management.  °  °This information is not intended to replace advice given to you by your health care provider. Make sure you discuss any questions you have with your health care provider. °  °Document Released: 10/17/2003 Document Revised: 11/04/2014 Document Reviewed: 03/08/2013 °Elsevier Interactive Patient Education ©2016 [employer] ° °

## 2016-01-19 NOTE — ED Notes (Signed)
CBG 385 

## 2016-01-19 NOTE — ED Notes (Signed)
Blood sugar at 0020 was taken at 2132 but just resulted over at 0020?? I personally checked this pt's CBG at 0021 and it was 303

## 2016-01-19 NOTE — ED Notes (Signed)
Per EDP request pt is to stay here a little while longer until his blood pressure is better.

## 2017-02-24 ENCOUNTER — Emergency Department (HOSPITAL_COMMUNITY)
Admission: EM | Admit: 2017-02-24 | Discharge: 2017-02-24 | Disposition: A | Discharge status: Home / Self Care | Payer: Medicaid Other | Attending: Emergency Medicine | Admitting: Emergency Medicine

## 2017-02-24 ENCOUNTER — Emergency Department (HOSPITAL_COMMUNITY): Payer: Medicaid Other

## 2017-02-24 ENCOUNTER — Encounter (HOSPITAL_COMMUNITY): Payer: Self-pay

## 2017-02-24 DIAGNOSIS — I10 Essential (primary) hypertension: Secondary | ICD-10-CM | POA: Insufficient documentation

## 2017-02-24 DIAGNOSIS — M25552 Pain in left hip: Secondary | ICD-10-CM | POA: Diagnosis present

## 2017-02-24 DIAGNOSIS — Z79899 Other long term (current) drug therapy: Secondary | ICD-10-CM | POA: Diagnosis not present

## 2017-02-24 DIAGNOSIS — E039 Hypothyroidism, unspecified: Secondary | ICD-10-CM | POA: Diagnosis not present

## 2017-02-24 DIAGNOSIS — W19XXXA Unspecified fall, initial encounter: Secondary | ICD-10-CM

## 2017-02-24 DIAGNOSIS — E119 Type 2 diabetes mellitus without complications: Secondary | ICD-10-CM | POA: Diagnosis not present

## 2017-02-24 DIAGNOSIS — F1290 Cannabis use, unspecified, uncomplicated: Secondary | ICD-10-CM | POA: Insufficient documentation

## 2017-02-24 HISTORY — DX: Unspecified viral hepatitis B without hepatic coma: B19.10

## 2017-02-24 MED ORDER — LIDOCAINE 5 % EX PTCH: 1.0000 | MEDICATED_PATCH | CUTANEOUS | 0 refills | Status: DC

## 2017-02-24 MED ORDER — KETOROLAC TROMETHAMINE 60 MG/2ML IM SOLN
60.0000 mg | Freq: Once | INTRAMUSCULAR | Status: AC
  Administered 2017-02-24: 60 mg via INTRAMUSCULAR
  Filled 2017-02-24: qty 2

## 2017-02-24 MED ORDER — DICLOFENAC SODIUM 75 MG PO TBEC: 75.0000 mg | DELAYED_RELEASE_TABLET | Freq: Two times a day (BID) | ORAL | 0 refills | Status: DC

## 2017-02-24 MED ORDER — OXYCODONE-ACETAMINOPHEN 5-325 MG PO TABS
2.0000 | ORAL_TABLET | Freq: Once | ORAL | Status: AC
  Administered 2017-02-24: 2 via ORAL
  Filled 2017-02-24: qty 2

## 2017-02-24 NOTE — ED Triage Notes (Signed)
Pt reports left hip pain for about 2 weeks and then pt fell 6 days ago and pain has increased. Pt reports that he fell due to his sugar being low
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
## 2017-02-24 NOTE — Discharge Instructions (Signed)
Your CT is negative for fracture of your pelvis or hips.  Apply a heating pad for 20 minutes several times daily to your site of pain.  You may take the medicine prescribed in addition to the chronic oxycodone you get from your doctor.  I have also prescribed you a pain patch which may be helpful.

## 2017-03-27 ENCOUNTER — Other Ambulatory Visit (HOSPITAL_COMMUNITY): Payer: Self-pay | Admitting: Family Medicine

## 2017-03-27 DIAGNOSIS — G8929 Other chronic pain: Secondary | ICD-10-CM

## 2017-03-27 DIAGNOSIS — M545 Low back pain: Principal | ICD-10-CM

## 2017-04-02 ENCOUNTER — Ambulatory Visit (HOSPITAL_COMMUNITY): Payer: Medicaid Other

## 2017-04-02 ENCOUNTER — Encounter (HOSPITAL_COMMUNITY): Payer: Self-pay

## 2019-04-02 ENCOUNTER — Emergency Department (HOSPITAL_COMMUNITY)
Admission: EM | Admit: 2019-04-02 | Discharge: 2019-04-02 | Disposition: A | Discharge status: Home / Self Care | Payer: Medicaid Other | Attending: Emergency Medicine | Admitting: Emergency Medicine

## 2019-04-02 ENCOUNTER — Emergency Department (HOSPITAL_COMMUNITY): Payer: Medicaid Other

## 2019-04-02 ENCOUNTER — Encounter (HOSPITAL_COMMUNITY): Payer: Self-pay | Admitting: Emergency Medicine

## 2019-04-02 ENCOUNTER — Other Ambulatory Visit: Payer: Self-pay

## 2019-04-02 DIAGNOSIS — I10 Essential (primary) hypertension: Secondary | ICD-10-CM | POA: Insufficient documentation

## 2019-04-02 DIAGNOSIS — R55 Syncope and collapse: Secondary | ICD-10-CM | POA: Insufficient documentation

## 2019-04-02 DIAGNOSIS — M545 Low back pain: Secondary | ICD-10-CM | POA: Diagnosis not present

## 2019-04-02 DIAGNOSIS — E1165 Type 2 diabetes mellitus with hyperglycemia: Secondary | ICD-10-CM | POA: Diagnosis not present

## 2019-04-02 DIAGNOSIS — M25551 Pain in right hip: Secondary | ICD-10-CM | POA: Insufficient documentation

## 2019-04-02 DIAGNOSIS — R739 Hyperglycemia, unspecified: Secondary | ICD-10-CM

## 2019-04-02 DIAGNOSIS — Z79899 Other long term (current) drug therapy: Secondary | ICD-10-CM | POA: Diagnosis not present

## 2019-04-02 DIAGNOSIS — E039 Hypothyroidism, unspecified: Secondary | ICD-10-CM | POA: Insufficient documentation

## 2019-04-02 DIAGNOSIS — W19XXXA Unspecified fall, initial encounter: Secondary | ICD-10-CM

## 2019-04-02 DIAGNOSIS — W010XXA Fall on same level from slipping, tripping and stumbling without subsequent striking against object, initial encounter: Secondary | ICD-10-CM | POA: Insufficient documentation

## 2019-04-02 DIAGNOSIS — Z794 Long term (current) use of insulin: Secondary | ICD-10-CM | POA: Diagnosis not present

## 2019-04-02 LAB — CBG MONITORING, ED
Glucose-Capillary: 404 mg/dL — ABNORMAL HIGH (ref 70–99)
Glucose-Capillary: 377 mg/dL — ABNORMAL HIGH (ref 70–99)
Glucose-Capillary: 284 mg/dL — ABNORMAL HIGH (ref 70–99)

## 2019-04-02 LAB — CBC WITH DIFFERENTIAL/PLATELET
Abs Immature Granulocytes: 0.02 10*3/uL (ref 0.00–0.07)
Basophils Absolute: 0 10*3/uL (ref 0.0–0.1)
Basophils Relative: 1 %
Eosinophils Absolute: 0.3 10*3/uL (ref 0.0–0.5)
Eosinophils Relative: 5 %
HCT: 45.1 % (ref 39.0–52.0)
Hemoglobin: 15.1 g/dL (ref 13.0–17.0)
Immature Granulocytes: 0 %
Lymphocytes Relative: 23 %
Lymphs Abs: 1.7 10*3/uL (ref 0.7–4.0)
MCH: 29.7 pg (ref 26.0–34.0)
MCHC: 33.5 g/dL (ref 30.0–36.0)
MCV: 88.6 fL (ref 80.0–100.0)
Monocytes Absolute: 0.7 10*3/uL (ref 0.1–1.0)
Monocytes Relative: 10 %
Neutro Abs: 4.4 10*3/uL (ref 1.7–7.7)
Neutrophils Relative %: 61 %
Platelets: 236 10*3/uL (ref 150–400)
RBC: 5.09 MIL/uL (ref 4.22–5.81)
RDW: 12.7 % (ref 11.5–15.5)
WBC: 7.2 10*3/uL (ref 4.0–10.5)
nRBC: 0 % (ref 0.0–0.2)

## 2019-04-02 LAB — BASIC METABOLIC PANEL
Anion gap: 14 (ref 5–15)
BUN: 10 mg/dL (ref 6–20)
CO2: 24 mmol/L (ref 22–32)
Calcium: 9.4 mg/dL (ref 8.9–10.3)
Chloride: 94 mmol/L — ABNORMAL LOW (ref 98–111)
Creatinine, Ser: 0.73 mg/dL (ref 0.61–1.24)
GFR calc Af Amer: >60 mL/min (ref >60)
GFR calc non Af Amer: >60 mL/min (ref >60)
Glucose, Bld: 571 mg/dL (ref 70–99)
Potassium: 4.4 mmol/L (ref 3.5–5.1)
Sodium: 132 mmol/L — ABNORMAL LOW (ref 135–145)

## 2019-04-02 LAB — URINALYSIS, ROUTINE W REFLEX MICROSCOPIC
Bacteria, UA: NONE SEEN
Bilirubin Urine: NEGATIVE
Glucose, UA: >=500 mg/dL — AB
Hgb urine dipstick: NEGATIVE
Ketones, ur: NEGATIVE mg/dL
Leukocytes,Ua: NEGATIVE
Nitrite: NEGATIVE
Protein, ur: NEGATIVE mg/dL
Specific Gravity, Urine: 1.035 — ABNORMAL HIGH (ref 1.005–1.030)
pH: 7 (ref 5.0–8.0)

## 2019-04-02 MED ORDER — INSULIN REGULAR(HUMAN) IN NACL 100-0.9 UT/100ML-% IV SOLN
INTRAVENOUS | Status: DC
  Administered 2019-04-02: 13:00:00 3.4 [IU]/h via INTRAVENOUS
  Filled 2019-04-02: qty 100

## 2019-04-02 MED ORDER — SODIUM CHLORIDE 0.9 % IV SOLN
INTRAVENOUS | Status: DC
  Administered 2019-04-02: 13:00:00 via INTRAVENOUS

## 2019-04-02 MED ORDER — INSULIN REGULAR BOLUS VIA INFUSION
0.0000 [IU] | Freq: Three times a day (TID) | INTRAVENOUS | Status: DC
  Filled 2019-04-02: qty 10

## 2019-04-02 MED ORDER — OXYCODONE-ACETAMINOPHEN 5-325 MG PO TABS: 1.0000 | ORAL_TABLET | ORAL | 0 refills | Status: AC | PRN

## 2019-04-02 MED ORDER — DEXTROSE 50 % IV SOLN: 25.0000 mL | INTRAVENOUS | Status: DC | PRN

## 2019-04-02 MED ORDER — INSULIN REGULAR HUMAN 100 UNIT/ML IJ SOLN: 15.0000 [IU] | Freq: Three times a day (TID) | INTRAMUSCULAR | 11 refills | Status: DC

## 2019-04-02 MED ORDER — MORPHINE SULFATE (PF) 4 MG/ML IV SOLN
4.0000 mg | Freq: Once | INTRAVENOUS | Status: AC
  Administered 2019-04-02: 4 mg via INTRAVENOUS
  Filled 2019-04-02: qty 1

## 2019-04-02 MED ORDER — DEXTROSE-NACL 5-0.45 % IV SOLN: INTRAVENOUS | Status: DC

## 2019-04-02 MED ORDER — INSULIN NPH (HUMAN) (ISOPHANE) 100 UNIT/ML ~~LOC~~ SUSP: 40.0000 [IU] | Freq: Every day | SUBCUTANEOUS | 0 refills | Status: DC

## 2019-04-02 MED ORDER — ONDANSETRON HCL 4 MG/2ML IJ SOLN
4.0000 mg | Freq: Once | INTRAMUSCULAR | Status: AC
  Administered 2019-04-02: 4 mg via INTRAVENOUS
  Filled 2019-04-02: qty 2

## 2019-04-02 MED ORDER — SODIUM CHLORIDE 0.9 % IV BOLUS
1000.0000 mL | Freq: Once | INTRAVENOUS | Status: AC
  Administered 2019-04-02: 12:00:00 1000 mL via INTRAVENOUS

## 2019-04-02 NOTE — Discharge Instructions (Signed)
Apply ice packs on and off to your hip.  Be sure to monitor your blood sugar closely.  Call Dr. Otilio Saber office to arrange a follow-up appointment.
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
## 2019-04-02 NOTE — ED Triage Notes (Signed)
Pt fell yesterday, injuring right hip.  Denies hitting head

## 2019-07-28 ENCOUNTER — Ambulatory Visit (INDEPENDENT_AMBULATORY_CARE_PROVIDER_SITE_OTHER): Payer: Medicaid Other | Admitting: Urology

## 2019-07-28 DIAGNOSIS — R3912 Poor urinary stream: Secondary | ICD-10-CM | POA: Diagnosis not present

## 2019-08-19 ENCOUNTER — Encounter (INDEPENDENT_AMBULATORY_CARE_PROVIDER_SITE_OTHER): Payer: Self-pay | Admitting: *Deleted

## 2019-09-08 ENCOUNTER — Ambulatory Visit (INDEPENDENT_AMBULATORY_CARE_PROVIDER_SITE_OTHER): Payer: Medicaid Other | Admitting: Urology

## 2019-09-08 DIAGNOSIS — R3912 Poor urinary stream: Secondary | ICD-10-CM | POA: Diagnosis not present

## 2019-09-08 DIAGNOSIS — N401 Enlarged prostate with lower urinary tract symptoms: Secondary | ICD-10-CM | POA: Diagnosis not present

## 2019-11-03 ENCOUNTER — Encounter: Payer: Self-pay | Admitting: Urology

## 2019-11-03 ENCOUNTER — Ambulatory Visit (INDEPENDENT_AMBULATORY_CARE_PROVIDER_SITE_OTHER): Payer: Medicaid Other | Admitting: Urology

## 2019-11-03 ENCOUNTER — Other Ambulatory Visit: Payer: Self-pay

## 2019-11-03 VITALS — BP 149/89 | HR 63 | Temp 98.3°F | Ht 72.0 in | Wt 235.0 lb

## 2019-11-03 DIAGNOSIS — N138 Other obstructive and reflux uropathy: Secondary | ICD-10-CM | POA: Diagnosis not present

## 2019-11-03 DIAGNOSIS — R3912 Poor urinary stream: Secondary | ICD-10-CM | POA: Diagnosis not present

## 2019-11-03 DIAGNOSIS — N401 Enlarged prostate with lower urinary tract symptoms: Secondary | ICD-10-CM

## 2019-11-03 LAB — BLADDER SCAN AMB NON-IMAGING: Scan Result: 40

## 2019-11-03 MED ORDER — IBUPROFEN 800 MG PO TABS: 800.0000 mg | ORAL_TABLET | Freq: Three times a day (TID) | ORAL | 0 refills | Status: AC | PRN
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
## 2019-11-03 NOTE — Progress Notes (Signed)
Urological Symptom Review  Patient is experiencing the following symptoms: Weak stream   Review of Systems  Gastrointestinal (upper)  : Negative for upper GI symptoms  Gastrointestinal (lower) : Negative for lower GI symptoms  Constitutional : Negative for symptoms  Skin: Negative for skin symptoms  Eyes: Negative for eye symptoms  Ear/Nose/Throat : Negative for Ear/Nose/Throat symptoms  Hematologic/Lymphatic: Negative for Hematologic/Lymphatic symptoms  Cardiovascular : Negative for cardiovascular symptoms  Respiratory : Negative for respiratory symptoms  Endocrine: Negative for endocrine symptoms  Musculoskeletal: Negative for musculoskeletal symptoms  Neurological: Negative for neurological symptoms  Psychologic: Negative for psychiatric symptoms  

## 2019-12-08 ENCOUNTER — Ambulatory Visit: Payer: Medicaid Other | Admitting: Urology

## 2020-04-09 ENCOUNTER — Emergency Department (HOSPITAL_COMMUNITY): Payer: Medicaid Other

## 2020-04-09 ENCOUNTER — Emergency Department (HOSPITAL_COMMUNITY)
Admission: EM | Admit: 2020-04-09 | Discharge: 2020-04-09 | Disposition: A | Discharge status: Home / Self Care | Payer: Medicaid Other | Attending: Emergency Medicine | Admitting: Emergency Medicine

## 2020-04-09 ENCOUNTER — Other Ambulatory Visit: Payer: Self-pay

## 2020-04-09 DIAGNOSIS — Y92009 Unspecified place in unspecified non-institutional (private) residence as the place of occurrence of the external cause: Secondary | ICD-10-CM | POA: Diagnosis not present

## 2020-04-09 DIAGNOSIS — W5501XA Bitten by cat, initial encounter: Secondary | ICD-10-CM | POA: Insufficient documentation

## 2020-04-09 DIAGNOSIS — S61255A Open bite of left ring finger without damage to nail, initial encounter: Secondary | ICD-10-CM | POA: Insufficient documentation

## 2020-04-09 DIAGNOSIS — M7989 Other specified soft tissue disorders: Secondary | ICD-10-CM

## 2020-04-09 DIAGNOSIS — E119 Type 2 diabetes mellitus without complications: Secondary | ICD-10-CM | POA: Insufficient documentation

## 2020-04-09 DIAGNOSIS — Z7984 Long term (current) use of oral hypoglycemic drugs: Secondary | ICD-10-CM | POA: Diagnosis not present

## 2020-04-09 DIAGNOSIS — I10 Essential (primary) hypertension: Secondary | ICD-10-CM | POA: Diagnosis not present

## 2020-04-09 DIAGNOSIS — E039 Hypothyroidism, unspecified: Secondary | ICD-10-CM | POA: Diagnosis not present

## 2020-04-09 DIAGNOSIS — Z79899 Other long term (current) drug therapy: Secondary | ICD-10-CM | POA: Insufficient documentation

## 2020-04-09 DIAGNOSIS — Y939 Activity, unspecified: Secondary | ICD-10-CM | POA: Diagnosis not present

## 2020-04-09 DIAGNOSIS — S61253A Open bite of left middle finger without damage to nail, initial encounter: Secondary | ICD-10-CM | POA: Insufficient documentation

## 2020-04-09 DIAGNOSIS — Y998 Other external cause status: Secondary | ICD-10-CM | POA: Insufficient documentation

## 2020-04-09 DIAGNOSIS — S6992XA Unspecified injury of left wrist, hand and finger(s), initial encounter: Secondary | ICD-10-CM | POA: Diagnosis present

## 2020-04-09 LAB — CBC
HCT: 46.1 % (ref 39.0–52.0)
Hemoglobin: 15.1 g/dL (ref 13.0–17.0)
MCH: 29.8 pg (ref 26.0–34.0)
MCHC: 32.8 g/dL (ref 30.0–36.0)
MCV: 91.1 fL (ref 80.0–100.0)
Platelets: 298 10*3/uL (ref 150–400)
RBC: 5.06 MIL/uL (ref 4.22–5.81)
RDW: 13.6 % (ref 11.5–15.5)
WBC: 7.2 10*3/uL (ref 4.0–10.5)
nRBC: 0 % (ref 0.0–0.2)

## 2020-04-09 LAB — BASIC METABOLIC PANEL
Anion gap: 15 (ref 5–15)
BUN: 18 mg/dL (ref 6–20)
CO2: 27 mmol/L (ref 22–32)
Calcium: 9.4 mg/dL (ref 8.9–10.3)
Chloride: 92 mmol/L — ABNORMAL LOW (ref 98–111)
Creatinine, Ser: 0.97 mg/dL (ref 0.61–1.24)
GFR calc Af Amer: >60 mL/min (ref >60)
GFR calc non Af Amer: >60 mL/min (ref >60)
Glucose, Bld: 461 mg/dL — ABNORMAL HIGH (ref 70–99)
Potassium: 3.9 mmol/L (ref 3.5–5.1)
Sodium: 134 mmol/L — ABNORMAL LOW (ref 135–145)

## 2020-04-09 MED ORDER — FENTANYL CITRATE (PF) 100 MCG/2ML IJ SOLN
50.0000 ug | Freq: Once | INTRAMUSCULAR | Status: AC
  Administered 2020-04-09: 50 ug via INTRAMUSCULAR
  Filled 2020-04-09: qty 2

## 2020-04-09 MED ORDER — AMOXICILLIN-POT CLAVULANATE 875-125 MG PO TABS: 1.0000 | ORAL_TABLET | Freq: Two times a day (BID) | ORAL | 0 refills | Status: DC

## 2020-04-09 MED ORDER — NAPROXEN 500 MG PO TABS: 500.0000 mg | ORAL_TABLET | Freq: Two times a day (BID) | ORAL | 0 refills | Status: DC

## 2020-04-09 MED ORDER — SODIUM CHLORIDE 0.9 % IV SOLN
1.5000 g | Freq: Once | INTRAVENOUS | Status: AC
  Administered 2020-04-09: 1.5 g via INTRAVENOUS
  Filled 2020-04-09: qty 4

## 2020-04-09 MED ORDER — FENTANYL CITRATE (PF) 100 MCG/2ML IJ SOLN
50.0000 ug | Freq: Once | INTRAMUSCULAR | Status: AC
  Administered 2020-04-09: 50 ug via INTRAVENOUS
  Filled 2020-04-09: qty 2

## 2020-04-09 NOTE — Discharge Instructions (Signed)
You have suffered an animal bite to your fingers, this will cause increasing swelling and pain and redness and fever if it goes untreated.  We have started an antibiotic and have prescribed a medicine called Augmentin for you to continue twice a day for the next 7 days.  I have also discussed your care with Dr. Eulah Pont, he has a orthopedic surgeon in [place], he would like to see you in the office tomorrow or Tuesday.  Please call the office first thing in the morning, someone should be able to see you.  If you develop increasing swelling pain fevers redness or any other severe or worsening symptoms you may return to the emergency department for further evaluation.  Naproxen twice a day for pain, you will need to talk to your family doctor about ongoing opiate medications due to your chronic pain, pain contract etc.

## 2020-04-09 NOTE — ED Triage Notes (Signed)
Patient comes to the ED with lynx cat bites to the left hand one week ago.  Patient has redness, swelling and pain to the left hand.
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
## 2020-04-10 ENCOUNTER — Other Ambulatory Visit (HOSPITAL_COMMUNITY)
Admission: RE | Admit: 2020-04-10 | Discharge: 2020-04-10 | Disposition: A | Discharge status: Home / Self Care | Payer: Medicaid Other | Source: Ambulatory Visit | Attending: Orthopedic Surgery | Admitting: Orthopedic Surgery

## 2020-04-10 ENCOUNTER — Encounter (HOSPITAL_BASED_OUTPATIENT_CLINIC_OR_DEPARTMENT_OTHER): Payer: Self-pay | Admitting: Orthopedic Surgery

## 2020-04-10 DIAGNOSIS — Z01812 Encounter for preprocedural laboratory examination: Secondary | ICD-10-CM | POA: Diagnosis not present

## 2020-04-10 DIAGNOSIS — Z20822 Contact with and (suspected) exposure to covid-19: Secondary | ICD-10-CM | POA: Diagnosis not present

## 2020-04-10 LAB — SARS CORONAVIRUS 2 (TAT 6-24 HRS): SARS Coronavirus 2: NEGATIVE

## 2020-04-10 NOTE — Progress Notes (Signed)
Spoke w/ via phone for pre-op interview--- PT Lab needs dos----  EKG             Lab results---- pt had CBC, BMP done yesterday @ED  at [facility] , results in epic COVID test ------ 04-10-2020 Arrive at ------- 0900 NPO after ------ [place] Medications to take morning of surgery ----- none Diabetic medication ----- pt stated he only take metformin at night, stated last  taken lantus insulin 5 days ago. Patient Special Instructions ----- n/a  Pre-Op special Istructions ----First  phone call to pt , started speaking with pt and he hung up on me ,upset he was told he needed caregiver at home after surgery.  Left pt message to call back , he did call back later.  Stated he had not slept in five days.  Pt is poor historian.  Pt was very short with his answers over the phone refused to complete medication list.  Stated someone else takes care of his medication and he did know what he takes but he was able to tell me he has had not insulin for 5 days and takes metformin and gabapentin at night. Only question's he did answer as far as health history was he denied any chest pain, sob, palpitations, irregular heartbeat, difficulty breathing and verified the he had completed harvoni treatment for hepatitis C.  Stated he has had many surgeries and know all about how things go. Updated pt's surgery's that were documented in epic/ care everywhere, pt refused to verify or name the surgery's he had.   Informed pt about his sugar from yesterday lab work at, 461, was high and possible may be issue with anesthesia to have surgery.  Pt stated he's heard it before.  Please have anesthesia be aware of all of this and have Dr 04-12-2020 speak to anesthesia. Pt states is having a Eulah Pont , Child psychotherapist with him dos, whom "takes care of every thing" and is transporting pt to and from facility.  May need Johnny to help with meds.  Pt stated he has arranged for caregiver at home and aware to have name and number when he check's in [place].    Patient verbalized understanding of instructions that were given at this phone interview. Patient denies shortness of breath, chest pain, fever, cough a this phone interview.   Anesthesia:  Pt very hard to ask and get answer's from pt for health history and medication list.  I did call [employer] , Tresa Endo scheduler for Dr [place], she stated Dr Eulah Pont is aware of pt being non-compliant and knows about sugar being high yesterday.  Eulah Pont stated they called pt's pcp , Dr R. Dondiego,  today and was told last A1c was 06/ 2020 it was over 12 and pt had lab draw 04/ 2021 and sugar was over 300.  2022 stated Dr Tresa Endo plans to do as minimal as possible.  Pt stated has not taken insulin in five days.

## 2020-04-10 NOTE — Anesthesia Preprocedure Evaluation (Addendum)
Anesthesia Evaluation  Patient identified by MRN, date of birth, ID band Patient awake    Reviewed: Allergy & Precautions, NPO status , Patient's Chart, lab work & pertinent test results  Airway Mallampati: I  TM Distance: >3 FB Neck ROM: Full    Dental no notable dental hx. (+) Edentulous Upper, Edentulous Lower   Pulmonary neg pulmonary ROS,    Pulmonary exam normal breath sounds clear to auscultation       Cardiovascular hypertension, Pt. on medications Normal cardiovascular exam Rhythm:Regular Rate:Normal     Neuro/Psych PSYCHIATRIC DISORDERS Bipolar Disorder negative neurological ROS     GI/Hepatic negative GI ROS, (+) Cirrhosis       , Hepatitis -, B  Endo/Other  diabetes, Type 2, Oral Hypoglycemic AgentsHypothyroidism   Renal/GU      Musculoskeletal  (+) Arthritis ,   Abdominal   Peds  Hematology   Anesthesia Other Findings   Reproductive/Obstetrics                            Anesthesia Physical Anesthesia Plan  ASA: III  Anesthesia Plan: General   Post-op Pain Management:    Induction: Intravenous  PONV Risk Score and Plan: 3 and Treatment may vary due to age or medical condition, Ondansetron and Dexamethasone  Airway Management Planned: LMA  Additional Equipment: None  Intra-op Plan:   Post-operative Plan:   Informed Consent: I have reviewed the patients History and Physical, chart, labs and discussed the procedure including the risks, benefits and alternatives for the proposed anesthesia with the patient or authorized representative who has indicated his/her understanding and acceptance.     Dental advisory given  Plan Discussed with:   Anesthesia Plan Comments:        Anesthesia Quick Evaluation

## 2020-04-11 ENCOUNTER — Encounter (HOSPITAL_BASED_OUTPATIENT_CLINIC_OR_DEPARTMENT_OTHER): Payer: Self-pay | Admitting: Orthopedic Surgery

## 2020-04-11 ENCOUNTER — Ambulatory Visit (HOSPITAL_BASED_OUTPATIENT_CLINIC_OR_DEPARTMENT_OTHER)
Admission: RE | Admit: 2020-04-11 | Discharge: 2020-04-11 | Disposition: A | Discharge status: Home / Self Care | Payer: Medicaid Other | Attending: Orthopedic Surgery | Admitting: Orthopedic Surgery

## 2020-04-11 ENCOUNTER — Other Ambulatory Visit: Payer: Self-pay

## 2020-04-11 ENCOUNTER — Ambulatory Visit (HOSPITAL_BASED_OUTPATIENT_CLINIC_OR_DEPARTMENT_OTHER): Payer: Medicaid Other | Admitting: Physician Assistant

## 2020-04-11 ENCOUNTER — Encounter (HOSPITAL_BASED_OUTPATIENT_CLINIC_OR_DEPARTMENT_OTHER)
Admission: RE | Disposition: A | Discharge status: Home / Self Care | Payer: Self-pay | Source: Home / Self Care | Attending: Orthopedic Surgery

## 2020-04-11 DIAGNOSIS — M1990 Unspecified osteoarthritis, unspecified site: Secondary | ICD-10-CM | POA: Diagnosis not present

## 2020-04-11 DIAGNOSIS — E119 Type 2 diabetes mellitus without complications: Secondary | ICD-10-CM | POA: Diagnosis not present

## 2020-04-11 DIAGNOSIS — S61253A Open bite of left middle finger without damage to nail, initial encounter: Secondary | ICD-10-CM | POA: Diagnosis present

## 2020-04-11 DIAGNOSIS — I10 Essential (primary) hypertension: Secondary | ICD-10-CM | POA: Diagnosis not present

## 2020-04-11 DIAGNOSIS — W5501XA Bitten by cat, initial encounter: Secondary | ICD-10-CM | POA: Insufficient documentation

## 2020-04-11 DIAGNOSIS — S61254A Open bite of right ring finger without damage to nail, initial encounter: Secondary | ICD-10-CM | POA: Insufficient documentation

## 2020-04-11 DIAGNOSIS — F319 Bipolar disorder, unspecified: Secondary | ICD-10-CM | POA: Insufficient documentation

## 2020-04-11 DIAGNOSIS — Z79899 Other long term (current) drug therapy: Secondary | ICD-10-CM | POA: Diagnosis not present

## 2020-04-11 DIAGNOSIS — Z791 Long term (current) use of non-steroidal anti-inflammatories (NSAID): Secondary | ICD-10-CM | POA: Diagnosis not present

## 2020-04-11 DIAGNOSIS — E039 Hypothyroidism, unspecified: Secondary | ICD-10-CM | POA: Insufficient documentation

## 2020-04-11 DIAGNOSIS — Z794 Long term (current) use of insulin: Secondary | ICD-10-CM | POA: Insufficient documentation

## 2020-04-11 DIAGNOSIS — L089 Local infection of the skin and subcutaneous tissue, unspecified: Secondary | ICD-10-CM | POA: Diagnosis not present

## 2020-04-11 DIAGNOSIS — Z79890 Hormone replacement therapy (postmenopausal): Secondary | ICD-10-CM | POA: Diagnosis not present

## 2020-04-11 HISTORY — DX: Unspecified osteoarthritis, unspecified site: M19.90

## 2020-04-11 HISTORY — PX: I & D EXTREMITY: SHX5045

## 2020-04-11 HISTORY — DX: Bipolar disorder, unspecified: F31.9

## 2020-04-11 HISTORY — DX: Other obstructive and reflux uropathy: N40.1

## 2020-04-11 HISTORY — DX: Other chronic pain: G89.29

## 2020-04-11 HISTORY — DX: Other obstructive and reflux uropathy: N13.8

## 2020-04-11 HISTORY — DX: Long term (current) use of insulin: E11.9

## 2020-04-11 HISTORY — DX: Chronic viral hepatitis C: B18.2

## 2020-04-11 HISTORY — DX: Antisocial personality disorder: F60.2

## 2020-04-11 LAB — GLUCOSE, CAPILLARY
Glucose-Capillary: 126 mg/dL — ABNORMAL HIGH (ref 70–99)
Glucose-Capillary: 131 mg/dL — ABNORMAL HIGH (ref 70–99)

## 2020-04-11 SURGERY — IRRIGATION AND DEBRIDEMENT EXTREMITY
Anesthesia: General | Site: Hand | Laterality: Left

## 2020-04-11 MED ORDER — BUPIVACAINE HCL 0.25 % IJ SOLN
INTRAMUSCULAR | Status: DC | PRN
  Administered 2020-04-11: 10 mL

## 2020-04-11 MED ORDER — LIDOCAINE 2% (20 MG/ML) 5 ML SYRINGE
INTRAMUSCULAR | Status: DC | PRN
  Administered 2020-04-11: 100 mg via INTRAVENOUS

## 2020-04-11 MED ORDER — ONDANSETRON HCL 4 MG/2ML IJ SOLN
INTRAMUSCULAR | Status: AC
  Filled 2020-04-11: qty 2

## 2020-04-11 MED ORDER — FENTANYL CITRATE (PF) 100 MCG/2ML IJ SOLN
50.0000 ug | Freq: Once | INTRAMUSCULAR | Status: AC
  Administered 2020-04-11: 50 ug via INTRAVENOUS

## 2020-04-11 MED ORDER — OXYCODONE HCL 5 MG PO TABS: 5.0000 mg | ORAL_TABLET | Freq: Four times a day (QID) | ORAL | 0 refills | Status: AC | PRN

## 2020-04-11 MED ORDER — CHLORHEXIDINE GLUCONATE 4 % EX LIQD: 60.0000 mL | Freq: Once | CUTANEOUS | Status: DC

## 2020-04-11 MED ORDER — MIDAZOLAM HCL 2 MG/2ML IJ SOLN
INTRAMUSCULAR | Status: AC
  Filled 2020-04-11: qty 2

## 2020-04-11 MED ORDER — CEFAZOLIN SODIUM-DEXTROSE 2-4 GM/100ML-% IV SOLN
2.0000 g | INTRAVENOUS | Status: AC
  Administered 2020-04-11: 2 g via INTRAVENOUS

## 2020-04-11 MED ORDER — FENTANYL CITRATE (PF) 100 MCG/2ML IJ SOLN
INTRAMUSCULAR | Status: AC
  Filled 2020-04-11: qty 2

## 2020-04-11 MED ORDER — PROPOFOL 10 MG/ML IV BOLUS
INTRAVENOUS | Status: AC
  Filled 2020-04-11: qty 20

## 2020-04-11 MED ORDER — CEFAZOLIN SODIUM-DEXTROSE 2-4 GM/100ML-% IV SOLN
INTRAVENOUS | Status: AC
  Filled 2020-04-11: qty 100

## 2020-04-11 MED ORDER — EPHEDRINE 5 MG/ML INJ
INTRAVENOUS | Status: AC
  Filled 2020-04-11: qty 10

## 2020-04-11 MED ORDER — ACETAMINOPHEN 500 MG PO TABS
ORAL_TABLET | ORAL | Status: AC
  Filled 2020-04-11: qty 2

## 2020-04-11 MED ORDER — ACETAMINOPHEN 500 MG PO TABS
1000.0000 mg | ORAL_TABLET | Freq: Once | ORAL | Status: AC
  Administered 2020-04-11: 1000 mg via ORAL

## 2020-04-11 MED ORDER — LIDOCAINE 2% (20 MG/ML) 5 ML SYRINGE
INTRAMUSCULAR | Status: AC
  Filled 2020-04-11: qty 5

## 2020-04-11 MED ORDER — MIDAZOLAM HCL 5 MG/5ML IJ SOLN
INTRAMUSCULAR | Status: DC | PRN
  Administered 2020-04-11: 2 mg via INTRAVENOUS
  Administered 2020-04-11 (×2): 1 mg via INTRAVENOUS

## 2020-04-11 MED ORDER — LACTATED RINGERS IV SOLN: INTRAVENOUS | Status: DC

## 2020-04-11 MED ORDER — ONDANSETRON HCL 4 MG/2ML IJ SOLN
INTRAMUSCULAR | Status: DC | PRN
  Administered 2020-04-11: 4 mg via INTRAVENOUS

## 2020-04-11 MED ORDER — DEXAMETHASONE SODIUM PHOSPHATE 4 MG/ML IJ SOLN
INTRAMUSCULAR | Status: DC | PRN
  Administered 2020-04-11: 10 mg via INTRAVENOUS

## 2020-04-11 MED ORDER — ONDANSETRON HCL 4 MG PO TABS: 4.0000 mg | ORAL_TABLET | Freq: Three times a day (TID) | ORAL | 0 refills | Status: AC | PRN

## 2020-04-11 MED ORDER — PROPOFOL 10 MG/ML IV BOLUS
INTRAVENOUS | Status: DC | PRN
  Administered 2020-04-11: 180 mg via INTRAVENOUS

## 2020-04-11 MED ORDER — EPHEDRINE SULFATE 50 MG/ML IJ SOLN
INTRAMUSCULAR | Status: DC | PRN
Start: 2020-04-11 — End: 2020-04-11
  Administered 2020-04-11 (×3): 20 mg via INTRAVENOUS

## 2020-04-11 MED ORDER — FENTANYL CITRATE (PF) 100 MCG/2ML IJ SOLN
INTRAMUSCULAR | Status: DC | PRN
  Administered 2020-04-11 (×2): 50 ug via INTRAVENOUS

## 2020-04-11 MED ORDER — DEXMEDETOMIDINE HCL IN NACL 200 MCG/50ML IV SOLN
INTRAVENOUS | Status: DC | PRN
  Administered 2020-04-11 (×2): 4 ug via INTRAVENOUS

## 2020-04-11 MED ORDER — AMOXICILLIN-POT CLAVULANATE 875-125 MG PO TABS
1.0000 | ORAL_TABLET | Freq: Two times a day (BID) | ORAL | 0 refills | Status: AC
Start: 2020-04-11 — End: 2020-04-21

## 2020-04-11 SURGICAL SUPPLY — 56 items
BANDAGE ESMARK 6X9 LF (GAUZE/BANDAGES/DRESSINGS) IMPLANT
BLADE SURG 10 STRL SS (BLADE) ×1 IMPLANT
BNDG CMPR 9X4 STRL LF SNTH (GAUZE/BANDAGES/DRESSINGS) ×1
BNDG CMPR 9X6 STRL LF SNTH (GAUZE/BANDAGES/DRESSINGS)
BNDG COHESIVE 4X5 TAN NS LF (GAUZE/BANDAGES/DRESSINGS) ×1 IMPLANT
BNDG COHESIVE 4X5 TAN STRL (GAUZE/BANDAGES/DRESSINGS) ×1 IMPLANT
BNDG ELASTIC 2X5.8 VLCR STR LF (GAUZE/BANDAGES/DRESSINGS) ×1 IMPLANT
BNDG ELASTIC 4X5.8 VLCR STR LF (GAUZE/BANDAGES/DRESSINGS) ×1 IMPLANT
BNDG ELASTIC 6X5.8 VLCR STR LF (GAUZE/BANDAGES/DRESSINGS) ×1 IMPLANT
BNDG ESMARK 4X9 LF (GAUZE/BANDAGES/DRESSINGS) ×1 IMPLANT
BNDG ESMARK 6X9 LF (GAUZE/BANDAGES/DRESSINGS)
BNDG GAUZE ELAST 4 BULKY (GAUZE/BANDAGES/DRESSINGS) ×1 IMPLANT
CORD BIPOLAR FORCEPS 12FT (ELECTRODE) ×1 IMPLANT
COVER BACK TABLE 60X90IN (DRAPES) ×1 IMPLANT
COVER WAND RF STERILE (DRAPES) ×2 IMPLANT
CUFF TOURN SGL LL 12 NO SLV (MISCELLANEOUS) IMPLANT
CUFF TOURN SGL QUICK 18X4 (TOURNIQUET CUFF) ×1 IMPLANT
CUFF TOURN SGL QUICK 34 (TOURNIQUET CUFF)
CUFF TRNQT CYL 34X4.125X (TOURNIQUET CUFF) IMPLANT
DRAPE EXTREMITY T 121X128X90 (DISPOSABLE) ×1 IMPLANT
DRAPE SURG 17X23 STRL (DRAPES) IMPLANT
DRAPE U-SHAPE 47X51 STRL (DRAPES) IMPLANT
DRSG PAD ABDOMINAL 8X10 ST (GAUZE/BANDAGES/DRESSINGS) ×2 IMPLANT
DURAPREP 26ML APPLICATOR (WOUND CARE) ×1 IMPLANT
ELECT REM PT RETURN 9FT ADLT (ELECTROSURGICAL) ×2
ELECTRODE REM PT RTRN 9FT ADLT (ELECTROSURGICAL) IMPLANT
EVACUATOR 1/8 PVC DRAIN (DRAIN) IMPLANT
GAUZE SPONGE 4X4 12PLY STRL (GAUZE/BANDAGES/DRESSINGS) ×2 IMPLANT
GAUZE XEROFORM 1X8 LF (GAUZE/BANDAGES/DRESSINGS) ×2 IMPLANT
GLOVE BIO SURGEON STRL SZ7.5 (GLOVE) ×4 IMPLANT
GLOVE BIOGEL PI IND STRL 8 (GLOVE) ×2 IMPLANT
GLOVE BIOGEL PI INDICATOR 8 (GLOVE) ×2
GOWN STRL REUS W/ TWL LRG LVL3 (GOWN DISPOSABLE) ×3 IMPLANT
GOWN STRL REUS W/TWL LRG LVL3 (GOWN DISPOSABLE) ×6
HANDPIECE INTERPULSE COAX TIP (DISPOSABLE)
KIT TURNOVER CYSTO (KITS) ×2 IMPLANT
MANIFOLD NEPTUNE II (INSTRUMENTS) IMPLANT
NDL HYPO 25X1 1.5 SAFETY (NEEDLE) IMPLANT
NEEDLE HYPO 25X1 1.5 SAFETY (NEEDLE) IMPLANT
NS IRRIG 1000ML POUR BTL (IV SOLUTION) ×2 IMPLANT
PACK ORTHO EXTREMITY (CUSTOM PROCEDURE TRAY) ×1 IMPLANT
PAD ARMBOARD 7.5X6 YLW CONV (MISCELLANEOUS) ×2 IMPLANT
PADDING UNDERCAST 2 STRL (CAST SUPPLIES) ×1
PADDING UNDERCAST 2X4 STRL (CAST SUPPLIES) IMPLANT
SET BASIN DAY SURGERY F.S. (CUSTOM PROCEDURE TRAY) ×2 IMPLANT
SET HNDPC FAN SPRY TIP SCT (DISPOSABLE) IMPLANT
SLING ARM FOAM STRAP LRG (SOFTGOODS) ×1 IMPLANT
SPONGE LAP 18X18 RF (DISPOSABLE) IMPLANT
SPONGE LAP 4X18 RFD (DISPOSABLE) ×1 IMPLANT
STOCKINETTE 4 (MISCELLANEOUS) ×1 IMPLANT
STOCKINETTE 4X48 STRL (DRAPES) IMPLANT
SUT ETHILON 3 0 PS 1 (SUTURE) ×1 IMPLANT
SUT PDS AB 2-0 CT1 27 (SUTURE) IMPLANT
SWAB CULTURE ESWAB REG 1ML (MISCELLANEOUS) ×1 IMPLANT
SYR CONTROL 10ML LL (SYRINGE) ×1 IMPLANT
YANKAUER SUCT BULB TIP NO VENT (SUCTIONS) ×2 IMPLANT

## 2020-04-11 NOTE — Op Note (Signed)
04/11/2020  1:00 PM  PATIENT:  Julian Evans    PRE-OPERATIVE DIAGNOSIS:  LEFT HAND INFECTION  POST-OPERATIVE DIAGNOSIS:  Same  PROCEDURE:  IRRIGATION AND DEBRIDEMENT LEFT HAND  SURGEON:  Sheral Apley, MD  ASSISTANT: Skip Mayer, PA-C, he was present and scrubbed throughout the case, critical for completion in a timely fashion, and for retraction, instrumentation, and closure.   ANESTHESIA:   gen  PREOPERATIVE INDICATIONS:  Julian Evans is a  59 y.o. male with a diagnosis of LEFT HAND INFECTION who failed conservative measures and elected for surgical management.    The risks benefits and alternatives were discussed with the patient preoperatively including but not limited to the risks of infection, bleeding, nerve injury, cardiopulmonary complications, the need for revision surgery, among others, and the patient was willing to proceed.  OPERATIVE IMPLANTS: nonne  OPERATIVE FINDINGS: dorsal fluid collection  BLOOD LOSS: min  COMPLICATIONS: none  TOURNIQUET TIME:  OPERATIVE PROCEDURE:  Patient was identified in the preoperative holding area and site was marked by me He was transported to the operating theater and placed on the table in supine position taking care to pad all bony prominences. After a preincinduction time out anesthesia was induced. The left upper extremity was prepped and draped in normal sterile fashion and a pre-incision timeout was performed. He received ancef for preoperative antibiotics.   I performed a digital block of his ring and long finger using 10 cc of Marcaine plain.  Made a longitudinal incision over P1 of his ring and long finger at site of erythema and swelling with possible fluid collection I expressed all dorsal fluid.  I probed with a hemostat to confirm no remaining pockets of purulence.  I performed a tenolysis of his extensor tendon on his ring finger and on his long finger.  After I completed the extensive debridement  using a hemostat of the ring and long finger to the level of the periosteum and fascia.  I then performed an irrigation with a liter of saline  I performed a loose closure sterile dressing was applied he was awoken taken to PACU in stable condition  POST OPERATIVE PLAN: mobilize for dvt px. Continue po abx

## 2020-04-11 NOTE — Discharge Instructions (Signed)
Elevate hand full time to reduce pain and swelling.  Diet: As you were doing prior to hospitalization   Shower:  May shower but keep the wounds dry, use an occlusive plastic wrap, NO SOAKING IN TUB.  If the bandage gets wet, change with a clean dry gauze.  If you have a splint on, leave the splint in place and keep the splint dry with a plastic bag.  Dressing:  Keep dressings on and dry until follow up.  Activity:  Increase activity slowly as tolerated, but follow the weight bearing instructions below.  The rules on driving is that you can not be taking narcotics while you drive, and you must feel in control of the vehicle.    Weight Bearing:   Limit use of left hand.   To prevent constipation: you may use a stool softener such as -  Colace (over the counter) 100 mg by mouth twice a day  Drink plenty of fluids (prune juice may be helpful) and high fiber foods Miralax (over the counter) for constipation as needed.    Itching:  If you experience itching with your medications, try taking only a single pain pill, or even half a pain pill at a time.  You can also use benadryl over the counter for itching or also to help with sleep.   Precautions:  If you experience chest pain or shortness of breath - call 911 immediately for transfer to the hospital emergency department!!  If you develop a fever greater that 101 F, purulent drainage from wound, increased redness or drainage from wound, or calf pain -- Call the office at [phone]                                                 Follow- Up Appointment:  Please call for an appointment to be seen in 1-2 weeks [place] - [phone]   Post Anesthesia Home Care Instructions  Activity: Get plenty of rest for the remainder of the day. A responsible individual must stay with you for 24 hours following the procedure.  For the next 24 hours, DO NOT: -Drive a car -Advertising copywriter -Drink alcoholic beverages -Take any medication unless  instructed by your physician -Make any legal decisions or sign important papers.  Meals: Start with liquid foods such as gelatin or soup. Progress to regular foods as tolerated. Avoid greasy, spicy, heavy foods. If nausea and/or vomiting occur, drink only clear liquids until the nausea and/or vomiting subsides. Call your physician if vomiting continues.  Special Instructions/Symptoms: Your throat may feel dry or sore from the anesthesia or the breathing tube placed in your throat during surgery. If this causes discomfort, gargle with warm salt water. The discomfort should disappear within 24 hours.

## 2020-04-11 NOTE — Interval H&P Note (Signed)
I participated in the care of this patient and agree with the above history, physical and evaluation. I performed a review of the history and a physical exam as detailed   Jaidyn Kuhl Daniel Anastasya Jewell MD  

## 2020-04-11 NOTE — Anesthesia Procedure Notes (Signed)
Procedure Name: LMA Insertion Date/Time: 04/11/2020 12:02 PM Performed by: Jessica Priest, CRNA Pre-anesthesia Checklist: Patient identified, Emergency Drugs available, Suction available and Patient being monitored Patient Re-evaluated:Patient Re-evaluated prior to induction Oxygen Delivery Method: Circle system utilized Preoxygenation: Pre-oxygenation with 100% oxygen Induction Type: IV induction Ventilation: Mask ventilation without difficulty LMA: LMA inserted LMA Size: 5.0 Number of attempts: 1 Airway Equipment and Method: Bite block Placement Confirmation: positive ETCO2,  CO2 detector and breath sounds checked- equal and bilateral Tube secured with: Tape Dental Injury: Teeth and Oropharynx as per pre-operative assessment

## 2020-04-11 NOTE — Anesthesia Postprocedure Evaluation (Signed)
Anesthesia Post Note  Patient: Julian Evans  Procedure(s) Performed: IRRIGATION AND DEBRIDEMENT LEFT HAND (Left Hand)     Patient location during evaluation: PACU Anesthesia Type: General Level of consciousness: awake and alert and oriented Pain management: pain level controlled Vital Signs Assessment: post-procedure vital signs reviewed and stable Respiratory status: spontaneous breathing, nonlabored ventilation and respiratory function stable Cardiovascular status: blood pressure returned to baseline Postop Assessment: no apparent nausea or vomiting Anesthetic complications: no   No complications documented.  Last Vitals:  Vitals:   04/11/20 1245 04/11/20 1300  BP: 128/73 113/69  Pulse: (!) 59 60  Resp: (!) 21 18  Temp: (!) 36.3 C   SpO2: 100% 96%    Last Pain:  Vitals:   04/11/20 1300  TempSrc:   PainSc: 0-No pain                 Kaylyn Layer
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
## 2020-04-11 NOTE — Progress Notes (Signed)
Unable to answers questions concerning meds and medical history. Some information obtained from [facility] caregiver.

## 2020-04-11 NOTE — Transfer of Care (Signed)
Immediate Anesthesia Transfer of Care Note  Patient: Julian Evans  Procedure(s) Performed: Procedure(s) (LRB): IRRIGATION AND DEBRIDEMENT LEFT HAND (Left)  Patient Location: PACU  Anesthesia Type: General  Level of Consciousness: awake, sedated, patient cooperative and responds to stimulation  Airway & Oxygen Therapy: Patient Spontanous Breathing and Patient connected to [place] 02 and soft FM   Post-op Assessment: Report given to PACU RN, Post -op Vital signs reviewed and stable and Patient moving all extremities  Post vital signs: Reviewed and stable  Complications: No apparent anesthesia complications

## 2020-04-12 ENCOUNTER — Encounter (HOSPITAL_BASED_OUTPATIENT_CLINIC_OR_DEPARTMENT_OTHER): Payer: Self-pay | Admitting: Orthopedic Surgery

## 2020-04-16 LAB — AEROBIC/ANAEROBIC CULTURE W GRAM STAIN (SURGICAL/DEEP WOUND)
Culture: NO GROWTH
Gram Stain: NONE SEEN

## 2020-07-24 ENCOUNTER — Ambulatory Visit: Payer: Medicaid Other | Admitting: Urology

## 2020-09-06 ENCOUNTER — Ambulatory Visit: Payer: Medicaid Other | Admitting: Urology

## 2020-09-06 DIAGNOSIS — N138 Other obstructive and reflux uropathy: Secondary | ICD-10-CM

## 2020-10-26 ENCOUNTER — Ambulatory Visit: Payer: Medicaid Other | Admitting: Podiatry

## 2020-11-28 DEATH — deceased
